# Patient Record
Sex: Male | Born: 1950 | Hispanic: No | Marital: Married | Smoking: Never smoker
Health system: Southern US, Community
[De-identification: ages and names within clinical notes are randomized; demographics above are authoritative.]

## PROBLEM LIST (undated history)

## (undated) DIAGNOSIS — E119 Type 2 diabetes mellitus without complications: Secondary | ICD-10-CM

## (undated) DIAGNOSIS — E785 Hyperlipidemia, unspecified: Secondary | ICD-10-CM

## (undated) DIAGNOSIS — IMO0001 Reserved for inherently not codable concepts without codable children: Secondary | ICD-10-CM

## (undated) DIAGNOSIS — E7800 Pure hypercholesterolemia, unspecified: Secondary | ICD-10-CM

## (undated) DIAGNOSIS — E1165 Type 2 diabetes mellitus with hyperglycemia: Secondary | ICD-10-CM

## (undated) DIAGNOSIS — R002 Palpitations: Secondary | ICD-10-CM

## (undated) HISTORY — DX: Palpitations: R00.2

## (undated) HISTORY — DX: Reserved for inherently not codable concepts without codable children: IMO0001

## (undated) HISTORY — DX: Pure hypercholesterolemia, unspecified: E78.00

## (undated) HISTORY — DX: Type 2 diabetes mellitus without complications: E11.9

## (undated) HISTORY — DX: Type 2 diabetes mellitus with hyperglycemia: E11.65

## (undated) HISTORY — DX: Hyperlipidemia, unspecified: E78.5

---

## 1997-12-14 ENCOUNTER — Inpatient Hospital Stay (HOSPITAL_COMMUNITY): Admission: EM | Admit: 1997-12-14 | Discharge: 1997-12-15 | Payer: Self-pay | Admitting: Emergency Medicine

## 1998-12-30 ENCOUNTER — Ambulatory Visit (HOSPITAL_COMMUNITY): Admission: RE | Admit: 1998-12-30 | Discharge: 1998-12-30 | Payer: Self-pay

## 2003-06-06 ENCOUNTER — Encounter: Admission: RE | Admit: 2003-06-06 | Discharge: 2003-06-06 | Payer: Self-pay | Admitting: Gastroenterology

## 2005-08-24 ENCOUNTER — Encounter: Admission: RE | Admit: 2005-08-24 | Discharge: 2005-08-24 | Payer: Self-pay | Admitting: Internal Medicine

## 2005-08-31 ENCOUNTER — Encounter: Admission: RE | Admit: 2005-08-31 | Discharge: 2005-08-31 | Payer: Self-pay | Admitting: Internal Medicine

## 2006-02-14 ENCOUNTER — Encounter (INDEPENDENT_AMBULATORY_CARE_PROVIDER_SITE_OTHER): Payer: Self-pay | Admitting: *Deleted

## 2006-02-14 ENCOUNTER — Ambulatory Visit (HOSPITAL_COMMUNITY): Admission: RE | Admit: 2006-02-14 | Discharge: 2006-02-14 | Payer: Self-pay | Admitting: Gastroenterology

## 2006-07-19 LAB — HM COLONOSCOPY

## 2007-02-22 ENCOUNTER — Encounter: Admission: RE | Admit: 2007-02-22 | Discharge: 2007-02-22 | Payer: Self-pay | Admitting: Neurology

## 2007-11-11 ENCOUNTER — Emergency Department (HOSPITAL_COMMUNITY): Admission: EM | Admit: 2007-11-11 | Discharge: 2007-11-11 | Payer: Self-pay | Admitting: Emergency Medicine

## 2007-11-11 ENCOUNTER — Ambulatory Visit: Payer: Self-pay | Admitting: Internal Medicine

## 2010-08-24 ENCOUNTER — Encounter (INDEPENDENT_AMBULATORY_CARE_PROVIDER_SITE_OTHER): Payer: Self-pay | Admitting: *Deleted

## 2010-08-24 ENCOUNTER — Other Ambulatory Visit: Payer: BC Managed Care – PPO

## 2010-08-24 ENCOUNTER — Ambulatory Visit (INDEPENDENT_AMBULATORY_CARE_PROVIDER_SITE_OTHER): Payer: BC Managed Care – PPO | Admitting: Endocrinology

## 2010-08-24 ENCOUNTER — Other Ambulatory Visit: Payer: Self-pay | Admitting: Endocrinology

## 2010-08-24 ENCOUNTER — Encounter: Payer: Self-pay | Admitting: Endocrinology

## 2010-08-24 DIAGNOSIS — E119 Type 2 diabetes mellitus without complications: Secondary | ICD-10-CM

## 2010-08-24 DIAGNOSIS — IMO0001 Reserved for inherently not codable concepts without codable children: Secondary | ICD-10-CM

## 2010-08-24 DIAGNOSIS — E1165 Type 2 diabetes mellitus with hyperglycemia: Secondary | ICD-10-CM | POA: Insufficient documentation

## 2010-08-24 HISTORY — DX: Reserved for inherently not codable concepts without codable children: IMO0001

## 2010-08-24 HISTORY — DX: Type 2 diabetes mellitus without complications: E11.9

## 2010-08-24 LAB — BASIC METABOLIC PANEL
CO2: 30 mEq/L (ref 19–32)
Chloride: 101 mEq/L (ref 96–112)
Creatinine, Ser: 0.9 mg/dL (ref 0.4–1.5)
Potassium: 4.7 mEq/L (ref 3.5–5.1)
Sodium: 138 mEq/L (ref 135–145)

## 2010-08-24 LAB — HEMOGLOBIN A1C: Hgb A1c MFr Bld: 8 % — ABNORMAL HIGH (ref 4.6–6.5)

## 2010-08-24 LAB — CK: Total CK: 56 U/L (ref 7–232)

## 2010-08-24 LAB — SEDIMENTATION RATE: Sed Rate: 20 mm/hr (ref 0–22)

## 2010-09-03 NOTE — Assessment & Plan Note (Signed)
Summary: NEW ENDO/BCBS/PCP=DR. BRUNETT/#/LB   Vital Signs:  Patient profile:   60 year old male Height:      71 inches (180.34 cm) Weight:      258.13 pounds (117.33 kg) BMI:     36.13 O2 Sat:      96 % on Room air Temp:     98.1 degrees F (36.72 degrees C) oral Pulse rate:   71 / minute Pulse rhythm:   regular BP sitting:   114 / 70  (left arm) Cuff size:   regular  Vitals Entered By: Brenton Grills CMA (AAMA) (August 24, 2010 1:23 PM)  O2 Flow:  Room air CC: New Endo Consult/DMII/aj Is Patient Diabetic? Yes   Primary Provider:  Mindi Curling MD  CC:  New Endo Consult/DMII/aj.  History of Present Illness: pt states 10 years h/o dm.  he is unaware of any chronic complications.  he has never been on insulin.  he takes 2 oral meds.  he says cbg's are 120-200.  it is in general higher later later in the day.   pt says his diet and exercise are much improved recently.  .   symptomatically, pt states 5 years of moderate myalgias at the legs/feet, and assoc erectile dysfunction.  avandia was changed to actos 4 mos ago, but myalgias have not improved.    Current Medications (verified): 1)  Actos 30 Mg Tabs (Pioglitazone Hcl) .Marland Kitchen.. 1 Tablet By Mouth Once Daily 2)  Metformin Hcl 1000 Mg Tabs (Metformin Hcl) .... 2 Tablet By Mouth Once Daily 3)  Enalapril Maleate 20 Mg Tabs (Enalapril Maleate) .Marland Kitchen.. 1 Tablet By Mouth Once Daily  Allergies (verified): No Known Drug Allergies  Past History:  Past Medical History: MYALGIA (ICD-729.1) DM (ICD-250.00)  Family History: Reviewed history and no changes required. Family History Ovarian cancer (Parent) Family History of Diabetes: sister  Social History: Reviewed history and no changes required. 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  The patient denies peripheral edema.         denies blurry vision, headache, chest pain, sob, n/v, urinary frequency, cramps, excessive diaphoresis, memory loss, depression, hypoglycemia, rhinorrhea, and easy bruising.  Physical Exam  General:  obese.  no distress  Head:  head: no deformity eyes: no periorbital swelling, no proptosis external nose and ears are normal mouth: no lesion seen Neck:  Supple without thyroid enlargement or tenderness.  Lungs:  Clear to auscultation bilaterally. Normal respiratory effort.  Heart:  Regular rate and rhythm without murmurs or gallops noted. Normal S1,S2.   Abdomen:  abdomen is soft, nontender.  no hepatosplenomegaly.   not distended.  no hernia central obesity.   Msk:  muscle bulk and strength are grossly normal.  no obvious joint swelling.  gait is normal and steady  Pulses:  dorsalis pedis intact bilat.  no carotid bruit  Extremities:  no deformity.  no ulcer on the feet.  feet are of normal color and temp.  there are healed abrasions on the left leg 1+ right pedal edema and 1+ left pedal edema.   Neurologic:  cn 2-12 grossly intact.   readily moves all 4's.   sensation is intact to touch on the feet  Skin:  normal texture and temp.  no rash.  not  diaphoretic  Cervical Nodes:  No significant adenopathy.  Psych:  Alert and cooperative; normal mood and affect; normal attention span and concentration.   Additional Exam:  Hemoglobin A1C       [H]  8.0 %                       4.6-6.5    Sed Rate                  20 mm/hr                    0-22    Creatine Kinase           56 U/L                      7-232    Sodium                    138 mEq/L                   135-145   Potassium                 4.7 mEq/L                   3.5-5.1   Chloride                  101 mEq/L                   96-112   Carbon Dioxide            30 mEq/L                    19-32   Glucose              [H]  170 mg/dL                    16-10   BUN                       15 mg/dL                    9-60   Creatinine                0.9 mg/dL                   4.5-4.0   Calcium                   9.2 mg/dL                   9.8-11.9    Impression & Recommendations:  Problem # 1:  DM (ICD-250.00) needs increased rx  Problem # 2:  MYALGIA (ICD-729.1) uncertain etiology not related to dm meds  Problem # 3:  edema this is a relative contraindication to actos  Medications Added to Medication List This Visit: 1)  Actos 30 Mg Tabs (Pioglitazone hcl) .Marland Kitchen.. 1 tablet by mouth once daily 2)  Metformin Hcl 1000 Mg Tabs (Metformin hcl) .... 2 tablet by mouth once daily 3)  Enalapril Maleate 20 Mg Tabs (Enalapril maleate) .Marland Kitchen.. 1 tablet by mouth once daily 4)  Januvia 100 Mg Tabs (Sitagliptin phosphate) .Marland Kitchen.. 1 tab each am  Other Orders: TLB-A1C / Hgb A1C (Glycohemoglobin) (83036-A1C) TLB-Sedimentation Rate (ESR) (85652-ESR)  TLB-CK Total Only(Creatine Kinase/CPK) (82550-CK) TLB-BMP (Basic Metabolic Panel-BMET) (80048-METABOL) New Patient Level IV (19147)  Patient Instructions: 1)  blood tests are being ordered for you today.  please call [phone] to hear your test results. 2)  pending the test results, please add januvia 100 mg once daily. 3)  good diet and exercise habits significanly improve the control of your diabetes.  please let me know if you wish to be referred to a dietician.  high blood sugar is very risky to your health.  you should see an eye doctor every year. 4)  controlling your blood pressure and cholesterol drastically reduces the damage diabetes does to your body.  this also applies to quitting smoking.  please discuss these with your doctor.  you should take an aspirin every day, unless you have been advised by a doctor not to. 5)  check your blood sugar 1 time a day.  vary the time of day when you check, between before the 3 meals, and at bedtime.  also check if you have symptoms of your blood sugar  being too high or too low.  please keep a record of the readings and bring it to your next appointment here.  please call us sooner if you are having low blood sugar episodes.   6)  Please schedule a follow-up appointment in 3 months. 7)  (update: i left message on phone-tree:  add januvia 100 mg each am) Prescriptions: JANUVIA 100 MG TABS (SITAGLIPTIN PHOSPHATE) 1 tab each am  #30 x 11   Entered and Authorized by:   Minus Breeding MD   Signed by:   Minus Breeding MD on 08/24/2010   Method used:   Electronically to        [facility]  [phone]* (retail)       [address]       [place], [place]  [address]       Ph: [phone] or [phone]       Fax: [phone]   RxID:   [id number]    Orders Added: 1)  TLB-A1C / Hgb A1C (Glycohemoglobin) [83036-A1C] 2)  TLB-Sedimentation Rate (ESR) [85652-ESR] 3)  TLB-CK Total Only(Creatine Kinase/CPK) [82550-CK] 4)  TLB-BMP (Basic Metabolic Panel-BMET) [80048-METABOL] 5)  New Patient Level IV [18841]   Immunization History:  Influenza Immunization History:    Influenza:  historical (03/19/2010)   Immunization History:  Influenza Immunization History:    Influenza:  Historical (03/19/2010)   Preventive Care Screening  Last Tetanus Booster:    Date:  07/19/2009    Results:  Adacel   Colonoscopy:    Date:  07/19/2006    Results:  done

## 2010-09-11 ENCOUNTER — Telehealth: Payer: Self-pay | Admitting: Endocrinology

## 2010-09-15 NOTE — Progress Notes (Signed)
Summary: Rx refill req  Phone Note Refill Request Message from:  Patient on September 11, 2010 1:55 PM  Refills Requested: Medication #1:  METFORMIN HCL 1000 MG TABS 2 tablet by mouth once daily   Dosage confirmed as above?Dosage Confirmed   Supply Requested: 1 year  Medication #2:  ENALAPRIL MALEATE 20 MG TABS 1 tablet by mouth once daily   Dosage confirmed as above?Dosage Confirmed   Supply Requested: 1 year  Medication #3:  JANUVIA 100 MG TABS 1 tab each am.   Dosage confirmed as above?Dosage Confirmed   Supply Requested: 1 year Pt requests Rxs to [facility]   Method Requested: Electronic Initial call taken by: Margaret Pyle, CMA,  September 11, 2010 1:56 PM    Prescriptions: JANUVIA 100 MG TABS (SITAGLIPTIN PHOSPHATE) 1 tab each am  #90 x 3   Entered by:   Margaret Pyle, CMA   Authorized by:   Minus Breeding MD   Signed by:   Margaret Pyle, CMA on 09/11/2010   Method used:   Electronically to        [facility]* (retail)       [address] [place], [place]  [address]       Ph: [phone]       Fax: [phone]   RxID:   [id number] ENALAPRIL MALEATE 20 MG TABS (ENALAPRIL MALEATE) 1 tablet by mouth once daily  #90 x 3   Entered by:   Margaret Pyle, CMA   Authorized by:   Minus Breeding MD   Signed by:   Margaret Pyle, CMA on 09/11/2010   Method used:   Electronically to        [facility]* (retail)       [address] [place], [place]  [address]       Ph: [phone]       Fax: [phone]   RxID:   [id number] METFORMIN HCL 1000 MG TABS (METFORMIN HCL) 2 tablet by mouth once daily  #180 x 3   Entered by:   Margaret Pyle, CMA   Authorized by:   Minus Breeding MD   Signed by:   Margaret Pyle, CMA on 09/11/2010   Method used:   Electronically to        [facility]* (retail)       [address] [place], [place]  [address]       Ph: [phone]       Fax: [phone]   RxID:   [id number]

## 2010-11-23 ENCOUNTER — Ambulatory Visit: Payer: BC Managed Care – PPO | Admitting: Endocrinology

## 2010-11-25 ENCOUNTER — Encounter: Payer: Self-pay | Admitting: Endocrinology

## 2010-11-25 ENCOUNTER — Other Ambulatory Visit (INDEPENDENT_AMBULATORY_CARE_PROVIDER_SITE_OTHER): Payer: BC Managed Care – PPO

## 2010-11-25 ENCOUNTER — Ambulatory Visit (INDEPENDENT_AMBULATORY_CARE_PROVIDER_SITE_OTHER): Payer: BC Managed Care – PPO | Admitting: Endocrinology

## 2010-11-25 VITALS — BP 100/66 | HR 69 | Temp 97.6°F | Ht 71.0 in | Wt 243.8 lb

## 2010-11-25 DIAGNOSIS — E119 Type 2 diabetes mellitus without complications: Secondary | ICD-10-CM

## 2010-11-25 LAB — GLUCOSE, POCT (MANUAL RESULT ENTRY): POC Glucose: 156

## 2010-11-25 LAB — HEMOGLOBIN A1C: Hgb A1c MFr Bld: 7.6 % — ABNORMAL HIGH (ref 4.6–6.5)

## 2010-11-25 MED ORDER — ENALAPRIL MALEATE 20 MG PO TABS: 20.0000 mg | ORAL_TABLET | Freq: Every day | ORAL | Status: DC

## 2010-11-25 MED ORDER — BROMOCRIPTINE MESYLATE 2.5 MG PO TABS: ORAL_TABLET | ORAL | Status: DC

## 2010-11-25 NOTE — Patient Instructions (Addendum)
blood tests are being ordered for you today.  please call [phone] to hear your test results.  You will be prompted to enter the 9-digit "MRN" number that appears at the top left of this page, followed by #.  Then you will hear the message. Based on the results, i will probably advise you to add another pill called "bromocriptine," at bedtime.  It has possible side-effects of dizziness and nausea.  These go away with time.  You can avoid these by starting with 1/2 pill for the first week, until your body gets used to it.   Please make a follow-up appointment in 3 months (update: i left message on phone-tree: add parlodel as above).

## 2010-11-25 NOTE — Progress Notes (Signed)
  Subjective:    Patient ID: Alexander Mckay, male    DOB: 12/11/1954, 60 y.o.   MRN: [id number]  HPI no cbg record, but states cbg's are still in the high-100's.  pt states he feels well in general.   Past Medical History  Diagnosis Date  . DM 08/24/2010  . MYALGIA 08/24/2010    No past surgical history on file.  History   Social History  . Marital Status: Married    Spouse Name: N/A    Number of Children: N/A  . Years of Education: N/A   Occupational History  .      Retired   Social History Main Topics  . Smoking status: Never Smoker   . Smokeless tobacco: Not on file  . Alcohol Use: No  . Drug Use: No  . Sexually Active:    Other Topics Concern  . 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 metFORMIN (GLUCOPHAGE) 1000 MG tablet 2 tablets by mouth once daily       . pioglitazone (ACTOS) 30 MG tablet Take 30 mg by mouth daily.        . sitaGLIPtan (JANUVIA) 100 MG tablet Take 100 mg by mouth daily.          No Known Allergies  Family History  Problem Relation Age of Onset  . Cancer Mother     Ovarian Cancer  . Diabetes Sister     BP 100/66  Pulse 69  Temp(Src) 97.6 F (36.4 C) (Oral)  Ht 5\' 11"  (1.803 m)  Wt 243 lb 12.8 oz (110.587 kg)  BMI 34.00 kg/m2  SpO2 95%    Review of Systems denies hypoglycemia.    Objective:   Physical Exam GENERAL: no distress Ext:  Trace bilat leg edema.    Lab Results  Component Value Date   HGBA1C 7.6* 11/25/2010     Assessment & Plan:  Dm, needs increased rx

## 2010-12-01 NOTE — Consult Note (Signed)
Alexander Mckay, KOEPPEN           ACCOUNT NO.:  [id number]   MEDICAL RECORD NO.:  [id number]           PATIENT TYPE:   LOCATION:                                 FACILITY:   PHYSICIAN:  Doylene Canning. Ladona Ridgel, MD    DATE OF BIRTH:  1951/02/24   DATE OF CONSULTATION:  DATE OF DISCHARGE:                                 CONSULTATION   The consultation is requested by Dr. Caryl Never for evaluation of SVT.  The patient's chief complaint is palpitations and shortness of breath  and feeling bad.   HISTORY OF PRESENT ILLNESS:  The patient is a 60 year old man with a  longstanding history of tachy palpitations.  He presented to the  hospital approximately 15 years ago with SVT and would receive treatment  with what sounds like adenosine.  He has had spells in the past, but no  prolonged episodes until he presented to Dr. Evelena Peat at  [facility] office with complaints of feeling bad and  palpitations and EKG subsequently demonstrates SVT at 180 beats per  minute.  He was transferred here for additional evaluation.  The patient  has not had any chest pain.  He has had no syncope.   PAST MEDICAL HISTORY:  Notable for SVT as noted.  He also has  hypertension x8 years.  No other significant medical problems that he  reports.   FAMILY HISTORY:  Notable for father dying of coronary disease in middle  age and mother died of kidney failure.   SOCIAL HISTORY:  The patient is married.  He denies tobacco or ethanol  abuse.  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  He has lived  in [place] for 30 years.   REVIEW OF SYSTEMS:  Really unremarkable except as noted in the HPI.  All  systems were reviewed.   PHYSICAL EXAMINATION:  GENERAL:  He is a pleasant well-appearing, middle-  aged man in no distress.  VITAL SIGNS:  The blood pressure was 115/80, the pulse was 180 and  regular, the respirations were 20, and temperature was 98.  HEENT:  Normocephalic and  atraumatic.  Pupils were equal and round.  Oropharynx was moist.  Sclerae anicteric.  NECK:  Revealed no jugular venous distention and no thyromegaly.  Trachea was midline.  Carotids were 2+ and symmetric.  LUNGS:  Clear bilaterally to auscultation.  No wheezes, rales, or  rhonchi were present.  There were no increased work of breathing.  CARDIOVASCULAR:  Revealed regular tachycardia with normal S1 and S2.  There were no murmurs appreciated.  The PMI was not enlarged nor was  laterally displaced.  ABDOMEN:  Soft, nontender, and nondistended.  No organomegaly.  The  bowel sounds were present.  No rebound or guarding.  EXTREMITIES:  Demonstrated no cyanosis, clubbing, or edema.  The pulses  were 2+ and symmetric.  NEUROLOGIC:  Alert and oriented x3.  His cranial nerves intact.  Strength was 5/5 and symmetric.   EKG demonstrates SVT at 180 beats per minute.  This is a short RP  tachycardia.  The old EKG demonstrates sinus rhythm with  no ventricular  pre-excitation.   IMPRESSION:  Supraventricular tachycardia.  Today, we tried having the  patient performing vagal maneuvers as well as carotid sinus massage,  which were unsuccessful and terminated his supraventricular tachycardia.  He was subsequently given 6 mg of adenosine, which terminated his  supraventricular tachycardia restoring sinus rhythm.  I planned to  observe the patient for approximately 1 hour.  We will plan on having  him go home.  We will have a stress test as an outpatient now.  I will  see him back in the office to discuss the possibility of catheter  ablation in the next several weeks.      Doylene Canning. Ladona Ridgel, MD  Electronically Signed     GWT/MEDQ  D:  11/11/2007  T:  11/11/2007  Job:  [id number]   cc:   Evelena Peat, M.D.

## 2010-12-04 NOTE — Op Note (Signed)
Alexander Mckay, Alexander Mckay           ACCOUNT NO.:  [id number]   MEDICAL RECORD NO.:  [id number]          PATIENT TYPE:  AMB   LOCATION:  ENDO                         FACILITY:  [facility]   PHYSICIAN:  Anselmo Rod, M.D.  DATE OF BIRTH:  12/11/1954   DATE OF PROCEDURE:  DATE OF DISCHARGE:                                 OPERATIVE REPORT   DATE OF PROCEDURE:  February 14, 2006.   PROCEDURE PERFORMED:  Colonoscopy with cold biopsy x1.  Endoscopy showed  __________ Olympus video colonoscope.   INDICATIONS FOR PROCEDURE:  This patient is a 60 year old male undergoing a  screening colonoscopy to rule out colonic polyps, masses, etc.  The patient  has a history of left lower quadrant discomfort and was found to be guaiac-  positive during a routine physical at the office.   PRE-PROCEDURE PREP:  Informed consent was secured from the patient.  The  patient fasted for eight hours prior to the procedure after being  prepped  with a gallon of GoLYTELY the night prior to the procedure.  Risks and  benefits of the procedure including a 10 percent missed rate of cancer and  polyps were discussed with the patient as well.   PRE-PROCEDURE PHYSICAL EXAMINATION:  The patient had stable vital signs.  Neck supple.  Chest clear to auscultation.  Abdomen was soft with normal  bowel sounds.   DESCRIPTION OF PROCEDURE:  The patient was placed in the left lateral  decubitus position.  Sedated with 75 mg of fentanyl and 7.5 mg of versed in  slow incremental doses.  Once the patient was adequately sedated and  maintained on low-flow oxygen and continuous cardiac monitoring, the Olympus  video colonoscope was advanced from the rectum to the cecum with difficulty.  There was significant __________ from the colon.  Multiple washings were  done.  The appendiceal orifice and ileocecal valve  were visualized and  __________ the terminal ileum.  A small sessile polyp was biopsied in the  proximal right colon with cold  biopsy times one.  There was no evidence of  diverticulosis.  Retroflexion revealed no abnormalities.  The patient  tolerated the procedure well without immediate complications.   IMPRESSION:  Screening colonoscopy within normal limits except for a small  sessile polyp, removed by cold biopsy from the proximal right colon.   RECOMMENDATIONS:  1.  We will await pathology results.  2.  Avoid all nonsteroidal anti-inflammatories for the next two weeks.  3.  Repeat guaiacs on an outpatient basis.  4.  The patient is to follow-up in the next two weeks.  5.  Repeat colonoscopy will be done depending on pathology results.           ______________________________  Anselmo Rod, M.D.     JNM/MEDQ  D:  02/14/2006  T:  02/15/2006  Job:  [id number]   cc:   Marjory Lies, M.D.  Fax: [phone]

## 2010-12-04 NOTE — Op Note (Signed)
Alexander Mckay, Alexander Mckay           ACCOUNT NO.:  [id number]   MEDICAL RECORD NO.:  [id number]          PATIENT TYPE:  AMB   LOCATION:  ENDO                         FACILITY:  [facility]   PHYSICIAN:  Anselmo Rod, M.D.  DATE OF BIRTH:  12/11/1954   DATE OF PROCEDURE:  DATE OF DISCHARGE:                               OPERATIVE REPORT   DATE OF PROCEDURE:  February 14, 2006.   PROCEDURE PERFORMED:  Colonoscopy with cold biopsy x1.   INDICATIONS FOR PROCEDURE:  This patient is a 60 year old male  undergoing a screening colonoscopy to rule out colonic polyps, masses,  etc.  The patient has a history of left lower quadrant discomfort and  was found to be guaiac-positive during a routine physical at the office.   PRE-PROCEDURE PREP:  Informed consent was secured from the patient.  The  patient fasted for eight hours prior to the procedure after being  prepped with a gallon of GoLYTELY the night prior to the procedure.  Risks and benefits of the procedure including a 10 percent missed rate  of cancer and polyps were discussed with the patient as well.   PRE-PROCEDURE PHYSICAL EXAMINATION:  The patient had stable vital signs.  Neck supple.  Chest clear to auscultation.  Abdomen was soft with normal  bowel sounds.   DESCRIPTION OF PROCEDURE:  The patient was placed in the left lateral  decubitus position.  Sedated with 75 mg of fentanyl and 7.5 mg of versed  in slow incremental doses.  Once the patient was adequately sedated and  maintained on low-flow oxygen and continuous cardiac monitoring, the  Olympus video colonoscope was advanced from the rectum to the cecum with  difficulty.  There was significant amount of residual stool in the  colon.  Multiple washings were done.  The appendiceal orifice and  ileocecal valve  were visualized and photos taken.  The terminal ileum  was not visualized.  A small sessile polyp was biopsied in the proximal  right colon with cold biopsy times one.  There  was no evidence of  diverticulosis.  Retroflexion revealed no abnormalities.  The patient  tolerated the procedure well without immediate complications.   IMPRESSION:  Screening colonoscopy within normal limits except for a  small sessile polyp, removed by cold biopsy from the proximal right  colon.   RECOMMENDATIONS:  1.  We will await pathology results.  2.  Avoid all nonsteroidal anti-inflammatories for the next two weeks.  3.  Repeat guaiacs on an outpatient basis.  4.  The patient is to follow-up in the next two weeks.  5.  Repeat colonoscopy will be done depending on pathology results.           ______________________________  Anselmo Rod, M.D.     JNM/MEDQ  D:  02/14/2006  T:  02/15/2006  Job:  [id number]   cc:   Marjory Lies, M.D.  Fax: [phone]

## 2012-11-17 ENCOUNTER — Ambulatory Visit (INDEPENDENT_AMBULATORY_CARE_PROVIDER_SITE_OTHER): Payer: BC Managed Care – PPO | Admitting: Family Medicine

## 2012-11-17 VITALS — BP 97/58 | HR 94 | Temp 99.4°F | Resp 16 | Ht 71.0 in | Wt 212.0 lb

## 2012-11-17 DIAGNOSIS — J329 Chronic sinusitis, unspecified: Secondary | ICD-10-CM

## 2012-11-17 DIAGNOSIS — J209 Acute bronchitis, unspecified: Secondary | ICD-10-CM

## 2012-11-17 MED ORDER — LEVOFLOXACIN 500 MG PO TABS: 500.0000 mg | ORAL_TABLET | Freq: Every day | ORAL | Status: DC

## 2012-11-17 NOTE — Progress Notes (Signed)
Patient ID: Dequan Kindred MRN: [id number], DOB: 12/11/1954, 62 y.o. Date of Encounter: 11/17/2012, 5:03 PM  Primary Physician: No primary provider on file.  Chief Complaint:  Chief Complaint  Patient presents with  . Cough    x 3 days  . Fever    low grade 99.4    HPI: 62 y.o. year old male presents with a 3 day history of nasal congestion, post nasal drip, sore throat, and cough. Mild sinus pressure. Afebrile. No chills. Nasal congestion thick and green/yellow. Cough is productive of green/yellow sputum and not associated with time of day. Ears feel full, leading to sensation of muffled hearing. Has tried OTC cold preps without success. No GI complaints.   No sick contacts, recent antibiotics, or recent travels.   No leg trauma, sedentary periods, h/o cancer, or tobacco use.  Past Medical History  Diagnosis Date  . DM 08/24/2010  . MYALGIA 08/24/2010     Home Meds: Prior to Admission medications   Medication Sig Start Date End Date Taking? Authorizing Provider  aspirin 81 MG tablet Take 81 mg by mouth daily.   Yes Historical Provider, MD  atorvastatin (LIPITOR) 20 MG tablet Take 20 mg by mouth daily.   Yes Historical Provider, MD  lisinopril (PRINIVIL,ZESTRIL) 10 MG tablet Take 10 mg by mouth daily.   Yes Historical Provider, MD  metFORMIN (GLUCOPHAGE) 1000 MG tablet 2 tablets by mouth once daily    Yes Historical Provider, MD    Allergies: No Known Allergies  History   Social History  . Marital Status: Married    Spouse Name: N/A    Number of Children: N/A  . Years of Education: N/A   Occupational History  .      Retired   Social History Main Topics  . Smoking status: Never Smoker   . Smokeless tobacco: Not on file  . Alcohol Use: No  . Drug Use: No  . Sexually Active:    Other Topics Concern  . Not on file   Social History Narrative   Regular exercise-yes   Works Real Estate   Originally from [place]     Review of Systems: Constitutional: negative  for chills, fever, night sweats or weight changes Cardiovascular: negative for chest pain or palpitations Respiratory: negative for hemoptysis, wheezing, or shortness of breath Abdominal: negative for abdominal pain, nausea, vomiting or diarrhea Dermatological: negative for rash Neurologic: negative for headache   Physical Exam: Blood pressure 97/58, pulse 94, temperature 99.4 F (37.4 C), temperature source Oral, resp. rate 16, height 5\' 11"  (1.803 m), weight 212 lb (96.163 kg), SpO2 95.00%., Body mass index is 29.58 kg/(m^2). General: Well developed, well nourished, in no acute distress. Head: Normocephalic, atraumatic, eyes without discharge, sclera non-icteric, nares are congested. Bilateral auditory canals clear, TM's are without perforation, pearly grey with reflective cone of light bilaterally. No sinus TTP. Oral cavity moist, dentition normal. Posterior pharynx with post nasal drip and mild erythema. No peritonsillar abscess or tonsillar exudate. Neck: Supple. No thyromegaly. Full ROM. No lymphadenopathy. Lungs: Coarse breath sounds bilaterally with, rales right side. Breathing is unlabored.  Heart: RRR with S1 S2. No murmurs, rubs, or gallops appreciated. Msk:  Strength and tone normal for age. Extremities: No clubbing or cyanosis. No edema. Neuro: Alert and oriented X 3. Moves all extremities spontaneously. CNII-XII grossly in tact. Psych:  Responds to questions appropriately with a normal affect.   Labs:   ASSESSMENT AND PLAN:  62 y.o. year old male with bronchitis.  Acute bronchitis - Plan: DISCONTINUED: levofloxacin (LEVAQUIN) 500 MG tablet  Sinusitis - Plan: DISCONTINUED: levofloxacin (LEVAQUIN) 500 MG tablet  -Mucinex -Tylenol/Motrin prn -Rest/fluids -RTC precautions -RTC 3-5 days if no improvement  Signed, Elvina Sidle, MD 11/17/2012 5:03 PM

## 2013-12-31 ENCOUNTER — Telehealth: Payer: Self-pay | Admitting: Endocrinology

## 2013-12-31 NOTE — Telephone Encounter (Signed)
Rec'd from Dr. London SheerPeter Dunn forward 4 pages to Dr.Ellison

## 2018-08-24 ENCOUNTER — Ambulatory Visit (INDEPENDENT_AMBULATORY_CARE_PROVIDER_SITE_OTHER): Payer: Medicare Other | Admitting: Cardiology

## 2018-08-24 ENCOUNTER — Encounter: Payer: Self-pay | Admitting: Cardiology

## 2018-08-24 DIAGNOSIS — E7800 Pure hypercholesterolemia, unspecified: Secondary | ICD-10-CM | POA: Diagnosis not present

## 2018-08-24 DIAGNOSIS — E1165 Type 2 diabetes mellitus with hyperglycemia: Secondary | ICD-10-CM

## 2018-08-24 HISTORY — DX: Pure hypercholesterolemia, unspecified: E78.00

## 2018-08-24 NOTE — Progress Notes (Signed)
Subjective:   @Patient  ID: Alexander Mckay, male    DOB: 12/11/1954, 68 y.o.   MRN: [id number]  No chief complaint on file.   HPI   Mr. Alexander Mckay is a pleasant gentleman [place] originally. He has diabetes which he states is uncontrolled He is no other specific complaints. He has h/o SVT per history many years ago and no recurrence of SVT over the last few years.  Patient denies any symptoms of claudication, TIA. He denies any chest pain or shortness of breath. No dizziness or syncope. He now presents here for his 834-month office visit, overall doing well and states that he wants a complete check up.  Denies any symptoms of claudication or TIA. He denies any headache or visual disturbances. No dizziness or syncope. Patient does have erectile dysfunction and responds well to Viagra.  Past Medical History:  Diagnosis Date  . Diabetes mellitus without complication (HCC)   . DM 08/24/2010  . Hyperlipidemia   . MYALGIA 08/24/2010  . Palpitations   . Pure hypercholesterolemia 08/24/2018  . Uncontrolled type 2 diabetes mellitus without complication (HCC) 08/24/2010   Qualifier: Diagnosis of  By: Everardo AllEllison MD, Sean A     No past surgical history on file.  Social History   Socioeconomic History  . Marital status: Married    Spouse name: Not on file  . Number of children: 0  . Years of education: Not on file  . Highest education level: Not on file  Occupational History    Comment: Retired  Engineer, productionocial Needs  . Financial resource strain: Not on file  . Food insecurity:    Worry: Not on file    Inability: Not on file  . Transportation needs:    Medical: Not on file    Non-medical: Not on file  Tobacco Use  . Smoking status: Never Smoker  . Smokeless tobacco: Never Used  Substance and Sexual Activity  . Alcohol use: Not Currently  . Drug use: Never  . Sexual activity: Not on file  Lifestyle  . Physical activity:    Days per week: Not on file    Minutes per session: Not  on file  . Stress: Not on file  Relationships  . Social connections:    Talks on phone: Not on file    Gets together: Not on file    Attends religious service: Not on file    Active member of club or organization: Not on file    Attends meetings of clubs or organizations: Not on file    Relationship status: Not on file  . Intimate partner violence:    Fear of current or ex partner: Not on file    Emotionally abused: Not on file    Physically abused: Not on file    Forced sexual activity: Not on file  Other Topics Concern  . Not on file  Social History Narrative   ** Merged History Encounter **       Regular exercise-yes   Works Real Estate   Originally from [place]    Current Outpatient Medications on File Prior to Visit  Medication Sig Dispense Refill  . aspirin 81 MG chewable tablet Chew 81 mg by mouth daily.    Marland Kitchen. aspirin 81 MG tablet Take 81 mg by mouth daily.    Marland Kitchen. atorvastatin (LIPITOR) 20 MG tablet Take 20 mg by mouth daily.    Marland Kitchen. glipiZIDE (GLUCOTROL) 5 MG tablet Take 5 mg by mouth 2 (two) times daily before  a meal.    . lisinopril (PRINIVIL,ZESTRIL) 10 MG tablet Take 10 mg by mouth daily.    Marland Kitchen lisinopril (PRINIVIL,ZESTRIL) 10 MG tablet Take 10 mg by mouth daily. 1/2 tablet daily    . metFORMIN (GLUCOPHAGE) 1000 MG tablet 2 tablets by mouth once daily      No current facility-administered medications on file prior to visit.      ROS     Objective:  There were no vitals taken for this visit.  Physical Exam  Constitutional: He appears well-developed and well-nourished. No distress.  HENT:  Head: Atraumatic.  Eyes: Conjunctivae are normal.  Neck: Neck supple. No JVD present. No thyromegaly present.  Cardiovascular: Normal rate, regular rhythm, normal heart sounds and intact distal pulses. Exam reveals no gallop.  No murmur heard. Pulmonary/Chest: Effort normal and breath sounds normal.  Abdominal: Soft. Bowel sounds are normal.  Musculoskeletal: Normal range of  motion.        General: No edema.  Neurological: He is alert.  Skin: Skin is warm and dry.  Psychiatric: He has a normal mood and affect.    Assessment & Recommendations:  1. Hyperlipidemia EKG 09/07/18/20: Normal sinus rhythm at the rate of 63 bpm, normal axis, poor R-wave progression, probably normal variant.  Normal QT interval.  2. Uncontrolled DM without complications   Alexander Decamp, MD, [facility] 08/24/2018, 6:17 AM [facility] [place] Pager: [phone] Office: [phone] If no answer Cell [phone]

## 2018-09-07 ENCOUNTER — Encounter: Payer: Self-pay | Admitting: Cardiology

## 2018-09-07 ENCOUNTER — Ambulatory Visit (INDEPENDENT_AMBULATORY_CARE_PROVIDER_SITE_OTHER): Payer: Medicare Other | Admitting: Cardiology

## 2018-09-07 VITALS — BP 132/81 | HR 67 | Ht 67.0 in | Wt 226.0 lb

## 2018-09-07 DIAGNOSIS — E7800 Pure hypercholesterolemia, unspecified: Secondary | ICD-10-CM

## 2018-09-07 DIAGNOSIS — Z0189 Encounter for other specified special examinations: Secondary | ICD-10-CM | POA: Diagnosis not present

## 2018-09-07 DIAGNOSIS — Z8679 Personal history of other diseases of the circulatory system: Secondary | ICD-10-CM | POA: Diagnosis not present

## 2018-09-07 DIAGNOSIS — R002 Palpitations: Secondary | ICD-10-CM | POA: Insufficient documentation

## 2018-09-07 DIAGNOSIS — Z6835 Body mass index (BMI) 35.0-35.9, adult: Secondary | ICD-10-CM

## 2018-09-07 DIAGNOSIS — E1165 Type 2 diabetes mellitus with hyperglycemia: Secondary | ICD-10-CM | POA: Diagnosis not present

## 2018-09-07 DIAGNOSIS — E66812 Obesity, class 2: Secondary | ICD-10-CM

## 2018-09-07 DIAGNOSIS — IMO0001 Reserved for inherently not codable concepts without codable children: Secondary | ICD-10-CM

## 2018-09-07 HISTORY — DX: Palpitations: R00.2

## 2018-09-07 NOTE — Progress Notes (Signed)
Subjective:   @Patient  ID: Alexander Mckay, male    DOB: [birth year], 68 y.o.   MRN: [id number]  Chief Complaint  Patient presents with  . Palpitations  . Follow-up    HPI   Mr. Alexander Mckay is a pleasant gentleman [place] originally. He has diabetes which he states is uncontrolled He is no other specific complaints. He has h/o SVT per history many years ago and no recurrence of SVT over the last few years.  Patient denies any symptoms of claudication, TIA. He denies any chest pain or shortness of breath. No dizziness or syncope. He now presents here for his 49-month office visit, overall doing well and states that he wants a complete check up.  Denies any symptoms of claudication or TIA. He denies any headache or visual disturbances. No dizziness or syncope. Patient does have erectile dysfunction and responds well to Viagra. States he has been scheduled for a stress test by his PCP.  Past Medical History:  Diagnosis Date  . Diabetes mellitus without complication (HCC)   . DM 08/24/2010  . Hyperlipidemia   . MYALGIA 08/24/2010  . Palpitations   . Palpitations 09/07/2018  . Pure hypercholesterolemia 08/24/2018  . Uncontrolled type 2 diabetes mellitus without complication (HCC) 08/24/2010   Qualifier: Diagnosis of  By: Everardo All MD, Gregary Signs A     History reviewed. No pertinent surgical history.  Social History   Socioeconomic History  . Marital status: Married    Spouse name: Not on file  . Number of children: 0  . Years of education: Not on file  . Highest education level: Not on file  Occupational History    Comment: Retired  Engineer, production  . Financial resource strain: Not on file  . Food insecurity:    Worry: Not on file    Inability: Not on file  . Transportation needs:    Medical: Not on file    Non-medical: Not on file  Tobacco Use  . Smoking status: Never Smoker  . Smokeless tobacco: Never Used  Substance and Sexual Activity  . Alcohol use: Not Currently   . Drug use: Never  . Sexual activity: Not on file  Lifestyle  . Physical activity:    Days per week: Not on file    Minutes per session: Not on file  . Stress: Not on file  Relationships  . Social connections:    Talks on phone: Not on file    Gets together: Not on file    Attends religious service: Not on file    Active member of club or organization: Not on file    Attends meetings of clubs or organizations: Not on file    Relationship status: Not on file  . Intimate partner violence:    Fear of current or ex partner: Not on file    Emotionally abused: Not on file    Physically abused: Not on file    Forced sexual activity: Not on file  Other Topics Concern  . Not on file  Social History Narrative   ** Merged History Encounter **       Regular exercise-yes   Works Real Estate   Originally from [place]    Current Outpatient Medications on File Prior to Visit  Medication Sig Dispense Refill  . aspirin 81 MG chewable tablet Chew 81 mg by mouth daily.    Marland Kitchen atorvastatin (LIPITOR) 20 MG tablet Take 20 mg by mouth daily.    Marland Kitchen glipiZIDE (GLUCOTROL) 5 MG tablet  Take 5 mg by mouth 2 (two) times daily before a meal.    . lisinopril (PRINIVIL,ZESTRIL) 10 MG tablet Take 10 mg by mouth daily.    . Saxagliptin-Metformin (KOMBIGLYZE XR) 2.11-998 MG TB24 Take by mouth 3 (three) times daily.    Marland Kitchen aspirin 81 MG tablet Take 81 mg by mouth daily.    Marland Kitchen lisinopril (PRINIVIL,ZESTRIL) 10 MG tablet Take 10 mg by mouth daily. 1/2 tablet daily    . metFORMIN (GLUCOPHAGE) 1000 MG tablet 2 tablets by mouth once daily      No current facility-administered medications on file prior to visit.    Review of Systems  Constitutional: Negative for malaise/fatigue and weight loss.  Respiratory: Negative for cough, hemoptysis and shortness of breath.   Cardiovascular: Negative for chest pain, palpitations, claudication and leg swelling.  Gastrointestinal: Negative for abdominal pain, blood in stool,  constipation, heartburn and vomiting.  Genitourinary: Negative for dysuria.  Musculoskeletal: Negative for joint pain and myalgias.  Neurological: Negative for dizziness, focal weakness and headaches.  Endo/Heme/Allergies: Does not bruise/bleed easily.  Psychiatric/Behavioral: Negative for depression. The patient is not nervous/anxious.   All other systems reviewed and are negative.      Objective:  Blood pressure 132/81, pulse 67, height 5\' 7"  (1.702 m), weight 226 lb (102.5 kg), SpO2 97 %. Body mass index is 35.4 kg/m.  Physical Exam  Constitutional: He appears well-developed. No distress.  Moderately obese  HENT:  Head: Atraumatic.  Eyes: Conjunctivae are normal.  Neck: Neck supple. No JVD present. No thyromegaly present.  Cardiovascular: Normal rate, regular rhythm, normal heart sounds and intact distal pulses. Exam reveals no gallop.  No murmur heard. Pulmonary/Chest: Effort normal and breath sounds normal.  Abdominal: Soft. Bowel sounds are normal.  Musculoskeletal: Normal range of motion.        General: No edema.  Neurological: He is alert.  Skin: Skin is warm and dry.  Psychiatric: He has a normal mood and affect.   Assessment & Recommendations:  1. Hyperlipidemia  2. Uncontrolled DM without complications  3. 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 Terminated with carotid massage per patient.  EKG 09/07/18/20: Normal sinus rhythm at the rate of 63 bpm, normal axis, poor R-wave progression, probably normal variant.  Normal QT interval.  4. Moderate Obesity  Recommendation: He is here on annual visit and follow-up of palpitations, symptoms or remained stable and he has not had any further rapid applications. EKG reveals sinus rhythm, No significant abnormality. No evidence of ischemia. He continues to exercise at least 30 minutes 5 days a week on the treadmill and also doing some mild weight exercises, remains asymptomatic without chest pain or palpitations.  He has  been scheduled for a complete physical next month.  He states that he has been scheduled for a stress test.  Lipids are being managed by his PCP, diabetes has been uncontrolled, I have suggested that he go on Bydureon which will help with weight loss and also diabetes control. Referral to endocrinology was made to Dr. Reather Littler. I'll see him back on annual basis.  Yates Decamp, MD, [facility] 09/08/2018, 9:09 AM [facility] [place] Pager: [phone] Office: [phone] If no answer Cell [phone]

## 2018-09-08 ENCOUNTER — Encounter: Payer: Self-pay | Admitting: Cardiology

## 2018-09-08 NOTE — Progress Notes (Signed)
Pre charting and hence not complete.

## 2018-10-03 LAB — HM DIABETES EYE EXAM

## 2018-10-08 NOTE — Progress Notes (Signed)
Subjective:   _0  ID: Alexander Mckay, male    DOB: 07-27-1950, 68 y.o.   MRN: [id number]  Chief Complaint  Patient presents with  . Palpitations  . Follow-up    HPI   Mr. Drelyn Pistilli is a pleasant gentleman from [place] originally. He has uncontrolled diabetes, hyperlipidemia.  He has h/o SVT per history many years ago and no recurrence of SVT over the last few years.  Patient denies any symptoms of claudication, TIA. He denies any chest pain or shortness of breath. No dizziness or syncope. He underwent nuclear stress test by his PCP, he wanted to come back to discuss the results.  Patient was on lisinopril for many years and also glipizide, had developed angioedema, both the glipizide and lisinopril have been discontinued.  Since last office visit and making changes to his diabetes medications, states that his blood sugar is made much improved.  He has an appointment pending to see Dr. Elayne Snare for endocrinology.  Past Medical History:  Diagnosis Date  . Diabetes mellitus without complication ([place])   . DM 08/24/2010  . Hyperlipidemia   . MYALGIA 08/24/2010  . Palpitations   . Palpitations 09/07/2018  . Pure hypercholesterolemia 08/24/2018  . Uncontrolled type 2 diabetes mellitus without complication ([place]) 11/19/84   Qualifier: Diagnosis of  By: Loanne Drilling MD, Hilliard Clark A     History reviewed. No pertinent surgical history.  Social History   Socioeconomic History  . Marital status: Married    Spouse name: Not on file  . Number of children: 0  . Years of education: Not on file  . Highest education level: Not on file  Occupational History    Comment: Retired  Scientific laboratory technician  . Financial resource strain: Not on file  . Food insecurity:    Worry: Not on file    Inability: Not on file  . Transportation needs:    Medical: Not on file    Non-medical: Not on file  Tobacco Use  . Smoking status: Never Smoker  . Smokeless tobacco: Never Used  Substance and Sexual Activity   . Alcohol use: Not Currently  . Drug use: Never  . Sexual activity: Not on file  Lifestyle  . Physical activity:    Days per week: Not on file    Minutes per session: Not on file  . Stress: Not on file  Relationships  . Social connections:    Talks on phone: Not on file    Gets together: Not on file    Attends religious service: Not on file    Active member of club or organization: Not on file    Attends meetings of clubs or organizations: Not on file    Relationship status: Not on file  . Intimate partner violence:    Fear of current or ex partner: Not on file    Emotionally abused: Not on file    Physically abused: Not on file    Forced sexual activity: Not on file  Other Topics Concern  . Not on file  Social History Narrative   ** Merged History Encounter **       Regular exercise-yes   Works Real Estate   Originally from [place]    Current Outpatient Medications on File Prior to Visit  Medication Sig Dispense Refill  . aspirin 81 MG chewable tablet Chew 81 mg by mouth daily.    Marland Kitchen atorvastatin (LIPITOR) 20 MG tablet Take 20 mg by mouth daily.    . Saxagliptin-Metformin (  KOMBIGLYZE XR) 2.11-998 MG TB24 Take by mouth 2 (two) times daily.     Marland Kitchen aspirin 81 MG tablet Take 81 mg by mouth daily.    Marland Kitchen glipiZIDE (GLUCOTROL) 5 MG tablet Take 5 mg by mouth 2 (two) times daily before a meal.    . metFORMIN (GLUCOPHAGE) 1000 MG tablet 2 tablets by mouth once daily      No current facility-administered medications on file prior to visit.    Review of Systems  Constitutional: Negative for malaise/fatigue and weight loss.  Respiratory: Negative for cough, hemoptysis and shortness of breath.   Cardiovascular: Negative for chest pain, palpitations, claudication and leg swelling.  Gastrointestinal: Negative for abdominal pain, blood in stool, constipation, heartburn and vomiting.  Genitourinary: Negative for dysuria.  Musculoskeletal: Negative for joint pain and myalgias.   Neurological: Negative for dizziness, focal weakness and headaches.  Endo/Heme/Allergies: Does not bruise/bleed easily.  Psychiatric/Behavioral: Negative for depression. The patient is not nervous/anxious.   All other systems reviewed and are negative.   CARDIAC STUDIES:  LE arterial duplex 04/05/11: Normal ABI, mild plaque without significant stenosis  Nuclear stress 03/19/11: Exercise 6 minutes and 10 METs. Dyspnea. Mild apical thinning. No ischemia.  Recent Labs:   12/24/2013: HbA1c  9.7%.  12/12/2012: Free testosterone 6.5%, mildly reduced (7.2 to 24.0). Total serum testosterone 346, minimally decreased [phone]).  NMR LipoProfile 12/12/2012: Total cholesterol 169, triglycerides 99, HDL 39. LDL 110 mg. LDL particle number moderately elevated at 1345. LP-IR score mildly elevated at 61, suggests insulin resistance. CMP was within normal limits. TSH normal, vitamin D normal.        Objective:  Blood pressure 125/66, pulse 77, height 5' 11" (1.803 m), weight 222 lb 12.8 oz (101.1 kg), SpO2 99 %. Body mass index is 31.07 kg/m.  Physical Exam  Constitutional: He appears well-developed. No distress.  Moderately obese  HENT:  Head: Atraumatic.  Eyes: Conjunctivae are normal.  Neck: Neck supple. No JVD present. No thyromegaly present.  Cardiovascular: Normal rate, regular rhythm, normal heart sounds and intact distal pulses. Exam reveals no gallop.  No murmur heard. Pulmonary/Chest: Effort normal and breath sounds normal.  Abdominal: Soft. Bowel sounds are normal.  Musculoskeletal: Normal range of motion.        General: No edema.  Neurological: He is alert.  Skin: Skin is warm and dry.  Psychiatric: He has a normal mood and affect.   Assessment & Recommendations:  1. Hyperlipidemia  2. Uncontrolled DM without complications  3. 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 Terminated with carotid massage per patient.  EKG 09/07/18/20: Normal sinus rhythm at the rate of 63 bpm,  normal axis, poor R-wave progression, probably normal variant.  Normal QT interval.  4. Moderate Obesity  Laboratory exam: Exercise Myoview stress test 09/13/2018: No evidence of ischemia, normal LVEF at 72%.  Patient exercised for 7 minutes and achieved 8.4 METS.  Labs 09/04/2018: A1c 9.7%.  CBC normal, serum glucose 169 mg, BUN 28, creatinine 1.1, eGFR greater than 60 mL, potassium 5.6.  Total cholesterol 212, triglycerides 115, HDL 41, LDL 148.  Recommendation: He is here on annual visit and follow-up of palpitations, symptoms or remained stable and he has not had any further rapid palpitations.   Lipids are being managed by his PCP, recently started on Lipitor after his recent complete physical and labs were reviewed. Diabetes has been uncontrolled, and now therapy changes was done recently and patient reports improved blood glucose on home monitoring. Referral to endocrinology was made  to Dr. Elayne Snare and he will be seeing him next month.   I also reviewed his recently performed stress test, low risk stress test.  From cardiac standpoint he remained stable, no changes in the medications were done today. I'll see him back on annual basis. This is a 25 minute office visit with evaluation of his labs performed by his PCP and stress test and answering the questions related to the stress test and cardiac risk factors.  Adrian Prows, MD, [facility] 10/09/2018, 10:24 AM [facility] [place] Pager: [phone] Office: [phone] If no answer Cell [phone]

## 2018-10-09 ENCOUNTER — Ambulatory Visit (INDEPENDENT_AMBULATORY_CARE_PROVIDER_SITE_OTHER): Payer: Medicare Other | Admitting: Cardiology

## 2018-10-09 ENCOUNTER — Encounter: Payer: Self-pay | Admitting: Cardiology

## 2018-10-09 ENCOUNTER — Other Ambulatory Visit: Payer: Self-pay

## 2018-10-09 VITALS — BP 125/66 | HR 77 | Ht 71.0 in | Wt 222.8 lb

## 2018-10-09 DIAGNOSIS — E7800 Pure hypercholesterolemia, unspecified: Secondary | ICD-10-CM | POA: Diagnosis not present

## 2018-10-09 DIAGNOSIS — IMO0001 Reserved for inherently not codable concepts without codable children: Secondary | ICD-10-CM

## 2018-10-09 DIAGNOSIS — E1165 Type 2 diabetes mellitus with hyperglycemia: Secondary | ICD-10-CM

## 2018-10-09 DIAGNOSIS — Z8679 Personal history of other diseases of the circulatory system: Secondary | ICD-10-CM | POA: Diagnosis not present

## 2018-10-25 ENCOUNTER — Ambulatory Visit: Payer: Medicare Other | Admitting: Endocrinology

## 2018-11-29 ENCOUNTER — Other Ambulatory Visit: Payer: Self-pay | Admitting: Endocrinology

## 2018-11-29 ENCOUNTER — Other Ambulatory Visit: Payer: Self-pay

## 2018-11-29 ENCOUNTER — Ambulatory Visit: Payer: Medicare Other | Admitting: Endocrinology

## 2018-11-29 ENCOUNTER — Other Ambulatory Visit (INDEPENDENT_AMBULATORY_CARE_PROVIDER_SITE_OTHER): Payer: Medicare Other

## 2018-11-29 DIAGNOSIS — E1165 Type 2 diabetes mellitus with hyperglycemia: Secondary | ICD-10-CM

## 2018-11-29 LAB — COMPREHENSIVE METABOLIC PANEL
ALT: 16 U/L (ref 0–53)
AST: 13 U/L (ref 0–37)
Albumin: 3.9 g/dL (ref 3.5–5.2)
Alkaline Phosphatase: 58 U/L (ref 39–117)
BUN: 23 mg/dL (ref 6–23)
CO2: 28 mEq/L (ref 19–32)
Calcium: 9.4 mg/dL (ref 8.4–10.5)
Chloride: 102 mEq/L (ref 96–112)
Creatinine, Ser: 0.97 mg/dL (ref 0.40–1.50)
GFR: 76.97 mL/min (ref >60.00)
Glucose, Bld: 132 mg/dL — ABNORMAL HIGH (ref 70–99)
Potassium: 4.2 mEq/L (ref 3.5–5.1)
Sodium: 138 mEq/L (ref 135–145)
Total Bilirubin: 0.3 mg/dL (ref 0.2–1.2)
Total Protein: 7.6 g/dL (ref 6.0–8.3)

## 2018-11-29 LAB — HEMOGLOBIN A1C: Hgb A1c MFr Bld: 7.3 % — ABNORMAL HIGH (ref 4.6–6.5)

## 2018-11-29 NOTE — Progress Notes (Signed)
Patient ID: Alexander Mckay, male   DOB: [birth year], 68 y.o.   MRN: [id number]           Reason for Appointment: Consultation for Type 2 Diabetes  Referring physician: Dr. Jacinto Halim   History of Present Illness:          Date of diagnosis of type 2 diabetes mellitus: About 2002      Background history:  He thinks his diabetes has been mild previously and was controlled with metformin for several years No records are available from his treating physician  Recent history:   Most recent A1c is 7.3 done on 11/29/2018, previously 9.7    Non-insulin hypoglycemic drugs the patient is taking are: Glipizide 5 mg at bedtime, Kombiglyze XR twice daily  Current management, blood sugar patterns and problems identified:  Although he thinks his blood sugars were 180-200s a couple of months ago they have been relatively better now  Reportedly had an A1c in February from PCP which was 9.7 and he thinks this was related to his traveling to his home country and eating poorly including sweets at that time  More recently he thinks he has been losing weight with being more consistent with diet  Also trying to exercise regularly with his stationary bike at home   His sugars have been somewhat variable, checking with the generic monitor  However today about 3 and half hours after breakfast his blood sugar is 151/110 5 fasting this morning at home        Side effects from medications have been: None      Typical meal intake: Breakfast is eggs and toast or oatmeal.  Avoiding fried food and regular soft drinks               Exercise:  Up to 45 minutes daily on stationary bike  Glucose monitoring:  done 1 times a day         Glucometer:  Reli[employer].       Blood Glucose readings by time of day from home record:  PREMEAL Breakfast Lunch Dinner Bedtime  Overall   Glucose range: 91-156      Median:        No readings after meals  Dietician visit, most recent:  Weight history:260  Wt Readings  from Last 3 Encounters:  11/30/18 217 lb 9.6 oz (98.7 kg)  10/09/18 222 lb 12.8 oz (101.1 kg)  09/07/18 226 lb (102.5 kg)    Glycemic control:   Lab Results  Component Value Date   HGBA1C 7.3 (H) 11/29/2018   HGBA1C 7.6 (H) 11/25/2010   HGBA1C 8.0 (H) 08/24/2010   Lab Results  Component Value Date   CREATININE 0.97 11/29/2018   No results found for: MICRALBCREAT  No results found for: FRUCTOSAMINE  Office Visit on 11/30/2018  Component Date Value Ref Range Status   POC Glucose 11/30/2018 150* 70 - 99 mg/dl Final  Lab on 64/40/3474  Component Date Value Ref Range Status   Sodium 11/29/2018 138  135 - 145 mEq/L Final   Potassium 11/29/2018 4.2  3.5 - 5.1 mEq/L Final   Chloride 11/29/2018 102  96 - 112 mEq/L Final   CO2 11/29/2018 28  19 - 32 mEq/L Final   Glucose, Bld 11/29/2018 132* 70 - 99 mg/dL Final   BUN 25/95/6387 23  6 - 23 mg/dL Final   Creatinine, Ser 11/29/2018 0.97  0.40 - 1.50 mg/dL Final   Total Bilirubin 11/29/2018 0.3  0.2 - 1.2 mg/dL Final  Alkaline Phosphatase 11/29/2018 58  39 - 117 U/L Final   AST 11/29/2018 13  0 - 37 U/L Final   ALT 11/29/2018 16  0 - 53 U/L Final   Total Protein 11/29/2018 7.6  6.0 - 8.3 g/dL Final   Albumin 16/04/9603 3.9  3.5 - 5.2 g/dL Final   Calcium 54/03/8118 9.4  8.4 - 10.5 mg/dL Final   GFR 14/78/2956 76.97  >60.00 mL/min Final   Hgb A1c MFr Bld 11/29/2018 7.3* 4.6 - 6.5 % Final   Glycemic Control Guidelines for People with Diabetes:Non Diabetic:  <6%Goal of Therapy: <7%Additional Action Suggested:  >8%     Allergies as of 11/30/2018      Reactions   Lisinopril Swelling   Glipizide    Lip swelling       Medication List       Accurate as of Nov 30, 2018 12:44 PM. If you have any questions, ask your nurse or doctor.        STOP taking these medications   atorvastatin 20 MG tablet Commonly known as:  LIPITOR Stopped by:  Reather Littler, MD   metFORMIN 1000 MG tablet Commonly known as:   GLUCOPHAGE Stopped by:  Reather Littler, MD     TAKE these medications   aspirin 81 MG chewable tablet Chew 81 mg by mouth daily.   canagliflozin 100 MG Tabs tablet Commonly known as:  Invokana 1 tablet before breakfast Started by:  Reather Littler, MD   glipiZIDE 5 MG tablet Commonly known as:  GLUCOTROL Take 5 mg by mouth at bedtime. TAKE 1 TABLET BY MOUTH ONCE DAILY AT BEDTIME.   Kombiglyze XR 2.11-998 MG Tb24 Generic drug:  Saxagliptin-Metformin Take by mouth 2 (two) times daily. Take 1 tablet by mouth in the morning and 1 tablet in the evening.  Allergies:  Allergies  Allergen Reactions   Lisinopril Swelling   Glipizide     Lip swelling     Past Medical History:  Diagnosis Date   Diabetes mellitus without complication (HCC)    DM 08/24/2010   Hyperlipidemia    MYALGIA 08/24/2010   Palpitations    Palpitations 09/07/2018   Pure hypercholesterolemia 08/24/2018   Uncontrolled type 2 diabetes mellitus without complication (HCC) 08/24/2010   Qualifier: Diagnosis of  By: Everardo AllEllison MD, Gregary SignsSean A     History reviewed. No pertinent surgical history.  Family History  Problem Relation Age of Onset   Cancer Mother        Ovarian Cancer   Heart attack Father    Diabetes Sister    Diabetes Brother    Diabetes Sister     Social History:  reports that he has never smoked. He has never used smokeless tobacco. He reports previous alcohol use. He reports that he does not use drugs.   Review of Systems  Constitutional: Positive for weight loss.  HENT: Negative for headaches.   Eyes: Negative for visual disturbance.  Respiratory: Negative for shortness of breath.   Cardiovascular: Negative for leg swelling.  Gastrointestinal: Negative for constipation and diarrhea.  Endocrine: Negative for fatigue.  Genitourinary: Negative for frequency.  Musculoskeletal: Negative for joint pain.  Skin: Negative for rash.  Neurological:       He has some pains in his lower legs and feet along with feeling of stiffness, no significant numbness or burning  Psychiatric/Behavioral: Negative for insomnia.     Lipid history: His last LDL was 148 He was recommended Lipitor 20 mg by his PCP but even though he has the prescription he has been reluctant to add another medication.   No results found for: CHOL, HDL, LDLCALC, LDLDIRECT, TRIG, CHOLHDL         Hypertension: Has been present  BP Readings from Last 3 Encounters:  11/30/18 120/70  10/09/18 125/66  09/07/18 132/81    Most recent eye exam was in 09/2018  Most recent foot  exam: 5/20  Currently known complications of diabetes: Peripheral neuropathy  LABS:  Office Visit on 11/30/2018  Component Date Value Ref Range Status   POC Glucose 11/30/2018 150* 70 - 99 mg/dl Final  Lab on 24/40/102705/13/2020  Component Date Value Ref Range Status   Sodium 11/29/2018 138  135 - 145 mEq/L Final   Potassium 11/29/2018 4.2  3.5 - 5.1 mEq/L Final   Chloride 11/29/2018 102  96 - 112 mEq/L Final   CO2 11/29/2018 28  19 - 32 mEq/L Final   Glucose, Bld 11/29/2018 132* 70 - 99 mg/dL Final   BUN 25/36/644005/13/2020 23  6 - 23 mg/dL Final   Creatinine, Ser 11/29/2018 0.97  0.40 - 1.50 mg/dL Final   Total Bilirubin 11/29/2018 0.3  0.2 - 1.2 mg/dL Final   Alkaline Phosphatase 11/29/2018 58  39 - 117 U/L Final   AST 11/29/2018 13  0 - 37 U/L Final   ALT 11/29/2018 16  0 - 53 U/L Final   Total Protein 11/29/2018 7.6  6.0 - 8.3 g/dL Final   Albumin 34/74/259505/13/2020 3.9  3.5 - 5.2 g/dL Final   Calcium 63/87/564305/13/2020 9.4  8.4 - 10.5 mg/dL Final   GFR 32/95/188405/13/2020 76.97  >60.00 mL/min Final   Hgb A1c MFr Bld 11/29/2018 7.3* 4.6 - 6.5 % Final   Glycemic Control Guidelines for People with Diabetes:Non Diabetic:  <6%Goal of Therapy: <7%Additional Action Suggested:  >  8%     Physical Examination:  BP 120/70 (BP Location: Left Arm, Patient Position: Sitting, Cuff Size: Large)    Pulse 70    Ht 5\' 11"  (1.803 m)    Wt 217 lb 9.6 oz (98.7 kg)    SpO2 95%    BMI 30.35 kg/m   GENERAL:         Patient has abdominal obesity.    HEENT:         Eye exam shows normal external appearance.  Fundus exam deferred to ophthalmologist  NECK:   There is no lymphadenopathy  Thyroid is not enlarged and no nodules felt.    LUNGS:         Chest is symmetrical. Lungs are clear to auscultation.Marland Kitchen   HEART:         Heart sounds:  S1 and S2 are normal. No murmur or click heard., no S3 or S4.   ABDOMEN:  Exam not indicated    NEUROLOGICAL:   Ankle jerks are absent bilaterally.    Diabetic Foot Exam - Simple    Simple Foot Form Diabetic Foot exam was performed with the following findings:  Yes   Visual Inspection No deformities, no ulcerations, no other skin breakdown bilaterally:  Yes Sensation Testing Intact to touch and monofilament testing bilaterally:  Yes Pulse Check See comments:  Yes Comments Dorsalis pedis pulses not palpable, posterior tibialis pulses 3+ on the right and 2+ on the left            Vibration sense is absent in distal first toes.  MUSCULOSKELETAL:  There is no swelling or deformity of the peripheral joints.     EXTREMITIES:     There is no ankle edema.  SKIN:    Has minimal eczematous changes on the left foot      ASSESSMENT:  Diabetes type 2  See history of present illness for detailed discussion of current diabetes management, blood sugar patterns and problems identified  Recent A1c indicates relatively better control but still at 7.3 is not at target  Current treatment regimen is glipizide and Kombiglyze XR Currently not on any diabetes medication that could provide reduced cardiovascular risk despite his multiple risk factors including family history of CAD  Complications of diabetes: Peripheral neuropathy with mild symptoms, objective signs are mostly related to absent vibration sense  HYPERLIPIDEMIA: Currently untreated with baseline LDL 148 He does not understand the importance of risk reduction with patients with diabetes with statin drugs and this was discussed  PLAN:    1. Glucose monitoring: He will be given a One Touch very monitor that is more accurate and can be downloaded  Patient advised to check readings either fasting or 2 hours after meals once a day  2.  Diabetes education:  Currently not needed but may consider nutritional consultation   3.  Lifestyle management: We will continue regular exercise as before and cut back on high carbohydrate and high fat foods consistently  4.  Medication changes needed: Discussed action of SGLT 2  drugs on lowering glucose by decreasing kidney absorption of glucose, benefits of weight loss and lower blood pressure, possible side effects, cardiovascular benefits and dosage regimen   He will start with 100 mg of Invokana in the morning  At the same time he will stop his glipizide  Encouraged him to increase his fluid intake  RESTART atorvastatin for hyperlipidemia that he has at home Follow-up lipids on the next visit  5.  Preventive care:  Given him handout on foot care for people with diabetes  6.  Follow-up: 6 weeks with labs    Patient Instructions  Check blood sugars on waking up 3 days a week  Also check blood sugars about 2 hours after meals and do this after different meals by rotation  Recommended blood sugar levels on waking up are 90-130 and about 2 hours after meal is 130-160  Please bring your blood sugar monitor to each visit, thank you  DIABETES medications:  Stop taking glipizide with starting [facility] Needs to be taken before breakfast daily Initially may notice increased urination during the day and recommend increasing your fluid intake  Start taking ATORVASTATIN prescribed by family doctor for cholesterol and heart risk reduction    Consultation note has been sent to the referring physician  Counseling time on subjects discussed in assessment and plan sections is over 50% of today's 45 minute visit   Reather Littler 11/30/2018, 12:44 PM   Note: This office note was prepared with Dragon voice recognition system technology. Any transcriptional errors that result from this process are unintentional.

## 2018-11-30 ENCOUNTER — Encounter: Payer: Self-pay | Admitting: Endocrinology

## 2018-11-30 ENCOUNTER — Other Ambulatory Visit: Payer: Self-pay

## 2018-11-30 ENCOUNTER — Ambulatory Visit (INDEPENDENT_AMBULATORY_CARE_PROVIDER_SITE_OTHER): Payer: Medicare Other | Admitting: Endocrinology

## 2018-11-30 VITALS — BP 120/70 | HR 70 | Ht 71.0 in | Wt 217.6 lb

## 2018-11-30 DIAGNOSIS — E782 Mixed hyperlipidemia: Secondary | ICD-10-CM | POA: Diagnosis not present

## 2018-11-30 DIAGNOSIS — E1165 Type 2 diabetes mellitus with hyperglycemia: Secondary | ICD-10-CM

## 2018-11-30 LAB — GLUCOSE, POCT (MANUAL RESULT ENTRY): POC Glucose: 150 mg/dl — AB (ref 70–99)

## 2018-11-30 MED ORDER — CANAGLIFLOZIN 100 MG PO TABS: ORAL_TABLET | ORAL | 3 refills | Status: DC

## 2018-11-30 NOTE — Patient Instructions (Addendum)
Check blood sugars on waking up 3 days a week  Also check blood sugars about 2 hours after meals and do this after different meals by rotation  Recommended blood sugar levels on waking up are 90-130 and about 2 hours after meal is 130-160  Please bring your blood sugar monitor to each visit, thank you  DIABETES medications:  Stop taking glipizide with starting [facility] Needs to be taken before breakfast daily Initially may notice increased urination during the day and recommend increasing your fluid intake  Start taking ATORVASTATIN prescribed by family doctor for cholesterol and heart risk reduction

## 2018-12-29 ENCOUNTER — Other Ambulatory Visit: Payer: Medicare Other

## 2019-01-03 ENCOUNTER — Ambulatory Visit: Payer: Medicare Other | Admitting: Endocrinology

## 2019-10-10 ENCOUNTER — Ambulatory Visit: Payer: Medicare Other | Admitting: Cardiology

## 2019-10-12 ENCOUNTER — Other Ambulatory Visit: Payer: Self-pay

## 2019-10-12 ENCOUNTER — Encounter: Payer: Self-pay | Admitting: Cardiology

## 2019-10-12 ENCOUNTER — Ambulatory Visit: Payer: Medicare HMO | Admitting: Cardiology

## 2019-10-12 VITALS — BP 125/65 | HR 80 | Temp 97.0°F | Resp 16 | Ht 71.0 in | Wt 223.6 lb

## 2019-10-12 DIAGNOSIS — E119 Type 2 diabetes mellitus without complications: Secondary | ICD-10-CM

## 2019-10-12 DIAGNOSIS — E7800 Pure hypercholesterolemia, unspecified: Secondary | ICD-10-CM

## 2019-10-12 DIAGNOSIS — Z8679 Personal history of other diseases of the circulatory system: Secondary | ICD-10-CM

## 2019-10-12 NOTE — Progress Notes (Signed)
Subjective:   '@Patient'$  ID: Alexander Mckay, male    DOB: 09/07/1950, 69 y.o.   MRN: [id number]  Chief Complaint  Patient presents with  . Hyperlipidemia  . Tachycardia  . Follow-up    1 year   HPI   Mr. Alexander Mckay is a pleasant gentleman from [place] originally. He has uncontrolled diabetes, hyperlipidemia.  He has h/o SVT per history many years ago and no recurrence of SVT over the last few years. He comes   Patient denies any symptoms of claudication, TIA. He denies any chest pain or shortness of breath. No dizziness or syncope. Since last office visit and making changes to his diabetes medications, states that his blood sugar is made much improved.     Past Medical History:  Diagnosis Date  . Diabetes mellitus without complication ([place])   . DM 08/24/2010  . Hyperlipidemia   . MYALGIA 08/24/2010  . Palpitations   . Palpitations 09/07/2018  . Pure hypercholesterolemia 08/24/2018  . Uncontrolled type 2 diabetes mellitus without complication 08/27/7679   Qualifier: Diagnosis of  By: Loanne Drilling MD, Hilliard Clark A    History reviewed. No pertinent surgical history.  Social History   Tobacco Use  . Smoking status: Never Smoker  . Smokeless tobacco: Never Used  Substance Use Topics  . Alcohol use: Not Currently     Current Outpatient Medications on File Prior to Visit  Medication Sig Dispense Refill  . aspirin 81 MG chewable tablet Chew 81 mg by mouth daily.    Marland Kitchen glipiZIDE (GLUCOTROL) 5 MG tablet Take 5 mg by mouth daily before breakfast.    . Saxagliptin-Metformin (KOMBIGLYZE XR) 2.11-998 MG TB24 Take by mouth 2 (two) times daily. Take 1 tablet by mouth in the morning and 1 tablet in the evening.     No current facility-administered medications on file prior to visit.   Review of Systems  Cardiovascular: Negative.   Gastrointestinal: Negative.   Neurological: Negative.     Objective:  Blood pressure 125/65, pulse 80, temperature (!) 97 F (36.1 C), temperature source  Temporal, resp. rate 16, height '5\' 11"'$  (1.803 m), weight 223 lb 9.6 oz (101.4 kg), SpO2 95 %. Body mass index is 31.19 kg/m.  Physical Exam  Constitutional: He appears well-developed. No distress.  Moderately obese  Neck: No JVD present.  Cardiovascular: Normal rate, regular rhythm, normal heart sounds and intact distal pulses. Exam reveals no gallop.  No murmur heard. Pulmonary/Chest: Effort normal and breath sounds normal.  Abdominal: Soft. Bowel sounds are normal.  Musculoskeletal:        General: No edema.  Psychiatric: He has a normal mood and affect.   Labs   A1C 7.300 11/29/2018  Creatinine, Serum 0.970 11/29/2018  Potassium 4.200 11/29/2018 ALT (SGPT) 16.000 5/13/20201C 7.300 11/29/2018  Creatinine, Serum 0.970 11/29/2018  Potassium 4.200 11/29/2018 ALT (SGPT) 16.000 11/29/2018  09/04/2018: A1c 9.7%.  CBC normal, serum glucose 169 mg, BUN 28, creatinine 1.1, eGFR greater than 60 mL, potassium 5.6.  Total cholesterol 212, triglycerides 115, HDL 41, LDL 148.  CARDIAC STUDIES:  LE arterial duplex 04/05/11: Normal ABI, mild plaque without significant stenosis  Exercise Myoview stress test 09/13/2018: No evidence of ischemia, normal LVEF at 72%.  Patient exercised for 7 minutes and achieved 8.4 METS.  EKG 10/12/2019: Normal sinus rhythm with rate of 76 bpm, left atrial enlargement, normal axis.  No evidence of ischemia, normal EKG. no significant change from 09/07/2018.  Assessment & Recommendations:     ICD-10-CM  1. H/O paroxysmal supraventricular tachycardia  Z86.79 EKG 12-Lead  2. Pure hypercholesterolemia  E78.00   3. Controlled type 2 diabetes mellitus without complication, without long-term current use of insulin (HCC)  E11.9     Recommendation: Patient with remote PSVT, has not had any further episodes, uncontrolled diabetes mellitus but now much improved with A1c approaching 7.0, reviewed his recent labs, hyperlipidemia, lipids also controlled presents here for  annual visit, he is asymptomatic from cardiac standpoint, continue primary prevention.  In view of his diabetes, I would highly recommend that patient be on an ARB.  He had developed angioedema on ACE inhibitor's.  We will request his PCP to take over.  He also has mild hyperlipidemia, again in view of diabetic state, would again recommend a low-dose high intensity statin for primary prevention.  Goal LDL in a diabetic patient being 70 or less he would certainly benefit from being on a statin.  I will see him back on a as needed basis.  Long discussion regarding primary prevention with the patient.  Adrian Prows, MD, [facility] 10/12/2019, 2:19 PM [facility] [place] Office: [phone]

## 2020-10-17 ENCOUNTER — Ambulatory Visit: Payer: Medicare HMO | Admitting: Cardiology

## 2020-10-17 NOTE — Progress Notes (Deleted)
Primary Physician/Referring:  Casilda Carls, MD  Patient ID: Alexander Mckay, male    DOB: Apr 13, 1951, 70 y.o.   MRN: [id number]  No chief complaint on file.  HPI:    Alexander Mckay  is a 70 y.o. pleasant gentleman from [place] originally. He has uncontrolled diabetes, hyperlipidemia.  He has h/o SVT per history many years ago and no recurrence of SVT over the last few years.   ***Patient was last seen in our office 10/12/2019 by Dr. Einar Gip at which time he was stable from a cardiovascular standpoint and recommended for as needed follow-up with primary prevention management by PCP.  Patient now comes in for follow-up of pure hypercholesterolemia.  ***  Patient denies any symptoms of claudication, TIA. He denies any chest pain or shortness of breath. No dizziness or syncope. Since last office visit and making changes to his diabetes medications, states that his blood sugar is made much improved.     Past Medical History:  Diagnosis Date  . Diabetes mellitus without complication ([place])   . DM 08/24/2010  . Hyperlipidemia   . MYALGIA 08/24/2010  . Palpitations   . Palpitations 09/07/2018  . Pure hypercholesterolemia 08/24/2018  . Uncontrolled type 2 diabetes mellitus without complication 08/23/9561   Qualifier: Diagnosis of  By: Loanne Drilling MD, Sean A    No past surgical history on file. Family History  Problem Relation Age of Onset  . Cancer Mother        Ovarian Cancer  . Heart attack Father   . Diabetes Sister   . Diabetes Brother   . Diabetes Sister     Social History   Tobacco Use  . Smoking status: Never Smoker  . Smokeless tobacco: Never Used  Substance Use Topics  . Alcohol use: Not Currently   Marital Status: Married   ROS  Review of Systems  Constitutional: Negative for malaise/fatigue and weight gain.  Cardiovascular: Negative for chest pain, claudication, leg swelling, near-syncope, orthopnea, palpitations, paroxysmal nocturnal dyspnea and syncope.  Respiratory:  Negative for shortness of breath.   Hematologic/Lymphatic: Does not bruise/bleed easily.  Gastrointestinal: Negative for melena.  Neurological: Negative for dizziness and weakness.    Objective  There were no vitals taken for this visit.  Vitals with BMI 10/12/2019 11/30/2018 10/09/2018  Height _0  _1  _2   Weight 223 lbs 10 oz 217 lbs 10 oz 222 lbs 13 oz  BMI 31.2 87.56 43.32  Systolic 951 884 166  Diastolic 65 70 66  Pulse 80 70 77      Physical Exam Vitals reviewed.  Constitutional:      Appearance: He is obese.  HENT:     Head: Normocephalic and atraumatic.  Cardiovascular:     Rate and Rhythm: Normal rate and regular rhythm.     Pulses: Intact distal pulses.     Heart sounds: S1 normal and S2 normal. No murmur heard. No gallop.   Pulmonary:     Effort: Pulmonary effort is normal. No respiratory distress.     Breath sounds: No wheezing, rhonchi or rales.  Musculoskeletal:     Right lower leg: No edema.     Left lower leg: No edema.  Neurological:     Mental Status: He is alert.     Laboratory examination:   No results for input(s): NA, K, CL, CO2, GLUCOSE, BUN, CREATININE, CALCIUM, GFRNONAA, GFRAA in the last 8760 hours. CrCl cannot be calculated (Patient's most recent lab result is older than the maximum 21 days  allowed.).  CMP Latest Ref Rng & Units 11/29/2018 08/24/2010  Glucose 70 - 99 mg/dL 132(H) 170(H)  BUN 6 - 23 mg/dL 23 15  Creatinine 0.40 - 1.50 mg/dL 0.97 0.9  Sodium 135 - 145 mEq/L 138 138  Potassium 3.5 - 5.1 mEq/L 4.2 4.7  Chloride 96 - 112 mEq/L 102 101  CO2 19 - 32 mEq/L 28 30  Calcium 8.4 - 10.5 mg/dL 9.4 9.2  Total Protein 6.0 - 8.3 g/dL 7.6 -  Total Bilirubin 0.2 - 1.2 mg/dL 0.3 -  Alkaline Phos 39 - 117 U/L 58 -  AST 0 - 37 U/L 13 -  ALT 0 - 53 U/L 16 -   No flowsheet data found.  Lipid Panel No results for input(s): CHOL, TRIG, LDLCALC, VLDL, HDL, CHOLHDL, LDLDIRECT in the last 8760 hours.  HEMOGLOBIN A1C Lab Results   Component Value Date   HGBA1C 7.3 (H) 11/29/2018   TSH No results for input(s): TSH in the last 8760 hours.  External labs:  11/29/2018: A1c 7.3% Creatinine 0.97, potassium 4.2, ALT 16  09/04/2018:  A1c 9.7%.   CBC normal,  serum glucose 169 mg, BUN 28, creatinine 1.1, eGFR greater than 60 mL, potassium 5.6.   Total cholesterol 212, triglycerides 115, HDL 41, LDL 148.  Medications and allergies   Allergies  Allergen Reactions  . Lisinopril Swelling  . Glipizide     Lip swelling      Outpatient Medications Prior to Visit  Medication Sig Dispense Refill  . aspirin 81 MG chewable tablet Chew 81 mg by mouth daily.    Marland Kitchen glipiZIDE (GLUCOTROL) 5 MG tablet Take 5 mg by mouth daily before breakfast.    . Saxagliptin-Metformin (KOMBIGLYZE XR) 2.11-998 MG TB24 Take by mouth 2 (two) times daily. Take 1 tablet by mouth in the morning and 1 tablet in the evening.     No facility-administered medications prior to visit.     Radiology:   No results found.  Cardiac Studies:    LE arterial duplex 04/05/11:  Normal ABI, mild plaque without significant stenosis  Exercise Myoview stress test 09/13/2018: No evidence of ischemia, normal LVEF at 72%.  Patient exercised for 7 minutes and achieved 8.4 METS.  EKG:   ***  EKG 10/12/2019: Normal sinus rhythm with rate of 76 bpm, left atrial enlargement, normal axis.  No evidence of ischemia, normal EKG. no significant change from 09/07/2018.  Assessment     ICD-10-CM   1. Pure hypercholesterolemia  E78.00   2. Controlled type 2 diabetes mellitus without complication, without long-term current use of insulin (HCC)  E11.9   3. H/O paroxysmal supraventricular tachycardia  Z86.79      There are no discontinued medications.  No orders of the defined types were placed in this encounter.   Recommendations:   Alexander Mckay is a 70 y.o. pleasant gentleman from [place] originally. He has uncontrolled diabetes, hyperlipidemia.  He has  h/o SVT per history many years ago and no recurrence of SVT over the last few years.   ***Patient was last seen in our office 10/12/2019 by Dr. Einar Gip at which time he was stable from a cardiovascular standpoint and recommended for as needed follow-up with primary prevention management by PCP.  Patient now comes in for follow-up of pure hypercholesterolemia.  ***  ***  Patient with remote PSVT, has not had any further episodes, uncontrolled diabetes mellitus but now much improved with A1c approaching 7.0, reviewed his recent labs, hyperlipidemia, lipids also controlled presents  here for annual visit, he is asymptomatic from cardiac standpoint, continue primary prevention.  In view of his diabetes, I would highly recommend that patient be on an ARB.  He had developed angioedema on ACE inhibitor's.  We will request his PCP to take over.  He also has mild hyperlipidemia, again in view of diabetic state, would again recommend a low-dose high intensity statin for primary prevention.  Goal LDL in a diabetic patient being 70 or less he would certainly benefit from being on a statin.  I will see him back on a as needed basis.  Long discussion regarding primary prevention with the patient.

## 2020-11-21 ENCOUNTER — Encounter: Payer: Self-pay | Admitting: Cardiology

## 2020-11-21 ENCOUNTER — Ambulatory Visit: Payer: Medicare HMO | Admitting: Cardiology

## 2020-11-21 ENCOUNTER — Other Ambulatory Visit: Payer: Self-pay

## 2020-11-21 VITALS — BP 114/71 | HR 89 | Temp 98.1°F | Resp 16 | Ht 71.0 in | Wt 210.6 lb

## 2020-12-05 ENCOUNTER — Ambulatory Visit: Payer: Medicare HMO | Admitting: Cardiology

## 2020-12-08 NOTE — Progress Notes (Signed)
OV cancelled due to hospital emergency JG

## 2020-12-09 ENCOUNTER — Ambulatory Visit: Payer: Medicare HMO | Admitting: Cardiology

## 2020-12-09 ENCOUNTER — Encounter: Payer: Self-pay | Admitting: Cardiology

## 2020-12-09 ENCOUNTER — Other Ambulatory Visit: Payer: Self-pay

## 2020-12-09 VITALS — BP 100/65 | HR 81 | Temp 97.6°F | Resp 16 | Ht 71.0 in | Wt 208.8 lb

## 2020-12-09 DIAGNOSIS — E1165 Type 2 diabetes mellitus with hyperglycemia: Secondary | ICD-10-CM

## 2020-12-09 DIAGNOSIS — E119 Type 2 diabetes mellitus without complications: Secondary | ICD-10-CM

## 2020-12-09 DIAGNOSIS — E7800 Pure hypercholesterolemia, unspecified: Secondary | ICD-10-CM

## 2020-12-09 DIAGNOSIS — R931 Abnormal findings on diagnostic imaging of heart and coronary circulation: Secondary | ICD-10-CM

## 2020-12-09 DIAGNOSIS — Z8679 Personal history of other diseases of the circulatory system: Secondary | ICD-10-CM

## 2020-12-09 NOTE — Progress Notes (Signed)
Subjective:   @Patient  ID: , male    DOB: 1951-04-17, 70 y.o.   MRN: [id number]  Chief Complaint  Patient presents with  . Hyperlipidemia  . Follow-up    1 year   HPI   Mr. Alexander Mckay is a 70 y.o. male patient from [place]. He has uncontrolled diabetes, hyperlipidemia, h/o SVT many years ago and no recurrence of SVT over the last few years. He comes for annual visit, wants to discuss primary prevention strategies.  Is presently asymptomatic but does admit to being extremely sedentary.  I have discussed with him regarding primary prevention.  He is concerned about one of his friends having had massive myocardial infarction and cardiac arrest in spite of "being healthy".  Patient denies any symptoms of claudication, TIA. He denies any chest pain or shortness of breath. No dizziness or syncope. Since last office visit and making changes to his diabetes medications, states that his blood sugar is made much improved since being on Synjardy.     Past Medical History:  Diagnosis Date  . Diabetes mellitus without complication (HCC)   . DM 08/24/2010  . Hyperlipidemia   . MYALGIA 08/24/2010  . Palpitations   . Palpitations 09/07/2018  . Pure hypercholesterolemia 08/24/2018  . Uncontrolled type 2 diabetes mellitus without complication 08/24/2010   Qualifier: Diagnosis of  By: 10/23/2010 MD, Everardo All A    History reviewed. No pertinent surgical history.  Social History   Tobacco Use  . Smoking status: Never Smoker  . Smokeless tobacco: Never Used  Substance Use Topics  . Alcohol use: Not Currently    Review of Systems  Respiratory: Negative for shortness of breath.   Cardiovascular: Negative.  Negative for chest pain, palpitations, claudication and leg swelling.  Gastrointestinal: Negative.   Genitourinary: Positive for dysuria.  Neurological: Negative for focal weakness.  All other systems reviewed and are negative.   Objective:  Blood pressure 100/65, pulse 81,  temperature 97.6 F (36.4 C), temperature source Temporal, resp. rate 16, height 5\' 11"  (1.803 m), weight 208 lb 12.8 oz (94.7 kg), SpO2 97 %. Body mass index is 29.12 kg/m.   Vitals with BMI 12/09/2020 11/21/2020 10/12/2019  Height 5\' 11"  5\' 11"  5\' 11"   Weight 208 lbs 13 oz 210 lbs 10 oz 223 lbs 10 oz  BMI 29.13 29.39 31.2  Systolic 100 114 01/21/2021  Diastolic 65 71 65  Pulse 81 89 80    Physical Exam Constitutional:      General: He is not in acute distress.    Appearance: He is well-developed.     Comments: Moderately obese  Neck:     Vascular: No JVD.  Cardiovascular:     Rate and Rhythm: Normal rate and regular rhythm.     Pulses: Intact distal pulses.     Heart sounds: Normal heart sounds. No murmur heard. No gallop.   Pulmonary:     Effort: Pulmonary effort is normal.     Breath sounds: Normal breath sounds.  Abdominal:     General: Bowel sounds are normal.     Palpations: Abdomen is soft.     Allergies  Allergen Reactions  . Lisinopril Swelling  . Glipizide     Lip swelling     Current Outpatient Medications on File Prior to Visit  Medication Sig Dispense Refill  . Empagliflozin-metFORMIN HCl ER (SYNJARDY XR) 12.11-998 MG TB24 Take 1 tablet by mouth 2 (two) times daily.    . Saxagliptin-Metformin 2.11-998 MG TB24 Take  by mouth 2 (two) times daily. Take 1 tablet by mouth in the morning and 1 tablet in the evening.    . Semaglutide (RYBELSUS) 3 MG TABS Take 1 tablet by mouth daily.     No current facility-administered medications on file prior to visit.    Labs   Labs 10/06/2020:  Serum glucose 274, BUN 21, creatinine 1.09, EGFR 73 mL, potassium 3.9.  A1c 11.0%.  A1C 7.300 11/29/2018  Creatinine, Serum 0.970 11/29/2018  Potassium 4.200 11/29/2018 ALT (SGPT) 16.000 5/13/20201C 7.300 11/29/2018  Creatinine, Serum 0.970 11/29/2018  Potassium 4.200 11/29/2018 ALT (SGPT) 16.000 11/29/2018  09/04/2018: A1c 9.7%.  CBC normal, serum glucose 169 mg, BUN 28, creatinine  1.1, eGFR greater than 60 mL, potassium 5.6.  Total cholesterol 212, triglycerides 115, HDL 41, LDL 148.  CARDIAC STUDIES:  LE arterial duplex 04/05/11: Normal ABI, mild plaque without significant stenosis  External Review: Exercise Myoview stress test 09/13/2018: No evidence of ischemia, normal LVEF at 72%.  Patient exercised for 7 minutes and achieved 8.4 METS.  Coronary calcium score 12/10/2020: 1. Total Angstrom coronary calcium score of 598, MeSA database percentile 92. Left main: 0 LAD: 375 Left circumflex: 132 RCA: 91. 2. Aortic valvular calcification without evidence of visible aneurysmal dilatation of the visualized ascending thoracic aorta. Correlation with echocardiography may be helpful. 3. Atherosclerosis of the descending thoracic aorta.  EKG:   EKG 11/21/2020: Normal sinus rhythm at the rate of 80 bpm, normal axis, poor R progression, probably normal variant.  Low-voltage complexes.  No evidence of ischemia.  EKG 10/12/2019: Normal sinus rhythm with rate of 76 bpm, left atrial enlargement, normal axis.  No evidence of ischemia, normal EKG. no significant change from 09/07/2018.  Assessment & Recommendations:     ICD-10-CM   1. H/O paroxysmal supraventricular tachycardia  Z86.79   2. Agatston CAC score, >400  R93.1   3. Pure hypercholesterolemia  E78.00 CT CARDIAC SCORING ([facility] LOCATIONS ONLY)    LDL cholesterol, direct    Lipid Panel With LDL/HDL Ratio  4. Uncontrolled type 2 diabetes mellitus with hyperglycemia (HCC)  E11.65 CT CARDIAC SCORING ([facility] LOCATIONS ONLY)    Hgb A1c w/o eAG    Recommendation:  Alexander Mckay is a 70 y.o. male patient from [place]. He has uncontrolled diabetes, hyperlipidemia, h/o SVT many years ago and no recurrence of SVT over the last few years. He comes for annual visit, wants to discuss primary prevention strategies.  Is presently asymptomatic but does admit to being extremely sedentary.  I have discussed with him regarding primary  prevention.  He is concerned about one of his friends having had massive myocardial infarction and cardiac arrest in spite of "being healthy".  In view of his diabetes, I would highly recommend that patient be on an ARB.  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 He also has hyperlipidemia and LDL is markedly elevated for diabetic state.  Would prefer to have his LDL closer to 70.  I will obtain coronary calcium score further to rule stratify him.  He is presently not on aspirin as his only risk factor is diabetes mellitus and there is no known vascular disease and also has hyperlipidemia.   I obtain the labs from PCP, I do not see his lipids, A1c is markedly elevated at 11.  I have recommended that we repeat A1c along with lipids.  I also reviewed the results of the coronary calcium score which is extremely high in the >90th percentile.  I would highly  recommend that he be on a statin as well.  Consider endocrine consult.  Patient plans to leave to [place] next month, I would like to see him back after his return or he can follow-up with his PCP for close monitoring.  This was a 40-minute office visit encounter in review of external records, external labs, discussions regarding primary prevention and logic behind use of guideline directed medical therapy.   Adrian Prows, MD, [facility] 12/10/2020, 6:03 PM Office: [phone] Fax: [phone] Pager: [phone]

## 2020-12-10 ENCOUNTER — Ambulatory Visit
Admission: RE | Admit: 2020-12-10 | Discharge: 2020-12-10 | Disposition: A | Discharge status: Home / Self Care | Payer: No Typology Code available for payment source | Source: Ambulatory Visit | Attending: Cardiology | Admitting: Cardiology

## 2020-12-10 DIAGNOSIS — E7800 Pure hypercholesterolemia, unspecified: Secondary | ICD-10-CM

## 2020-12-10 DIAGNOSIS — E119 Type 2 diabetes mellitus without complications: Secondary | ICD-10-CM

## 2020-12-10 MED ORDER — ASPIRIN 81 MG PO CHEW: 81.0000 mg | CHEWABLE_TABLET | Freq: Every day | ORAL | 3 refills | Status: AC

## 2020-12-10 NOTE — Progress Notes (Signed)
Coronary calcium score 12/10/2020: 1. Total Angstrom coronary calcium score of 598, MeSA database percentile 92. Left main: 0 LAD: 375 Left circumflex: 132 RCA: 91. 2. Aortic valvular calcification without evidence of visible aneurysmal dilatation of the visualized ascending thoracic aorta. Correlation with echocardiography may be helpful. 3. Atherosclerosis of the descending thoracic aorta.

## 2020-12-12 ENCOUNTER — Telehealth: Payer: Self-pay | Admitting: Cardiology

## 2020-12-12 DIAGNOSIS — R931 Abnormal findings on diagnostic imaging of heart and coronary circulation: Secondary | ICD-10-CM

## 2020-12-12 DIAGNOSIS — E7800 Pure hypercholesterolemia, unspecified: Secondary | ICD-10-CM

## 2020-12-12 LAB — HGB A1C W/O EAG: Hgb A1c MFr Bld: 7.9 % — ABNORMAL HIGH (ref 4.8–5.6)

## 2020-12-12 LAB — LIPID PANEL WITH LDL/HDL RATIO
Cholesterol, Total: 154 mg/dL (ref 100–199)
HDL: 29 mg/dL — ABNORMAL LOW (ref >39)
LDL Chol Calc (NIH): 102 mg/dL — ABNORMAL HIGH (ref 0–99)
LDL/HDL Ratio: 3.5 ratio (ref 0.0–3.6)
Triglycerides: 129 mg/dL (ref 0–149)
VLDL Cholesterol Cal: 23 mg/dL (ref 5–40)

## 2020-12-12 LAB — LDL CHOLESTEROL, DIRECT: LDL Direct: 98 mg/dL (ref 0–99)

## 2020-12-12 MED ORDER — ATORVASTATIN CALCIUM 20 MG PO TABS: 20.0000 mg | ORAL_TABLET | Freq: Every day | ORAL | 3 refills | Status: DC

## 2020-12-12 NOTE — Telephone Encounter (Signed)
    ICD-10-CM   1. Hypercholesteremia  E78.00 atorvastatin (LIPITOR) 20 MG tablet  2. Agatston CAC score, >400  R93.1 atorvastatin (LIPITOR) 20 MG tablet    Lipid Panel Recent Labs    12/11/20 0810  CHOL 154  TRIG 129  LDLCALC 102*  HDL 29*  LDLDIRECT 98    Meds ordered this encounter  Medications  . atorvastatin (LIPITOR) 20 MG tablet    Sig: Take 1 tablet (20 mg total) by mouth daily.    Dispense:  90 tablet    Refill:  3     Yates Decamp, MD, [facility] 12/12/2020, 5:11 PM Office: [phone] Fax: [phone] Pager: [phone]

## 2020-12-17 ENCOUNTER — Ambulatory Visit: Payer: Medicare HMO | Admitting: Cardiology

## 2021-02-17 ENCOUNTER — Ambulatory Visit: Payer: Medicare HMO | Admitting: Cardiology

## 2021-02-17 ENCOUNTER — Encounter: Payer: Self-pay | Admitting: Cardiology

## 2021-02-17 ENCOUNTER — Other Ambulatory Visit: Payer: Self-pay

## 2021-02-17 VITALS — BP 126/68 | HR 89 | Temp 98.2°F | Resp 16 | Ht 71.0 in | Wt 211.6 lb

## 2021-02-17 DIAGNOSIS — I25118 Atherosclerotic heart disease of native coronary artery with other forms of angina pectoris: Secondary | ICD-10-CM

## 2021-02-17 DIAGNOSIS — R931 Abnormal findings on diagnostic imaging of heart and coronary circulation: Secondary | ICD-10-CM

## 2021-02-17 DIAGNOSIS — E7800 Pure hypercholesterolemia, unspecified: Secondary | ICD-10-CM

## 2021-02-17 DIAGNOSIS — R0789 Other chest pain: Secondary | ICD-10-CM

## 2021-02-17 DIAGNOSIS — E119 Type 2 diabetes mellitus without complications: Secondary | ICD-10-CM

## 2021-02-17 MED ORDER — NITROGLYCERIN 0.4 MG SL SUBL: 0.4000 mg | SUBLINGUAL_TABLET | SUBLINGUAL | 3 refills | Status: DC | PRN

## 2021-02-17 NOTE — Progress Notes (Signed)
Subjective:   _0  ID: Alexander Mckay, male    DOB: 05/20/1951, 70 y.o.   MRN: [id number]  Chief Complaint  Patient presents with   Chest Pain   HPI   Alexander Mckay is a 70 y.o. male patient from [place]. He has uncontrolled diabetes, hyperlipidemia, h/o SVT many years ago and no recurrence of SVT over the last few years. He comes for annual visit, wants to discuss primary prevention strategies.  Is presently asymptomatic but does admit to being extremely sedentary.  I have discussed with him regarding primary prevention.  He is concerned about one of his friends having had massive myocardial infarction and cardiac arrest in spite of "being healthy".  Patient denies any symptoms of claudication, TIA.  He denies any chest pain or shortness of breath.  No dizziness or syncope. Since last office visit and making changes to his diabetes medications, states that his blood sugar is made much improved since being on Synjardy.     Past Medical History:  Diagnosis Date   Diabetes mellitus without complication ([place])    DM 08/24/2010   Hyperlipidemia    MYALGIA 08/24/2010   Palpitations    Palpitations 09/07/2018   Pure hypercholesterolemia 08/24/2018   Uncontrolled type 2 diabetes mellitus without complication 03/24/931   Qualifier: Diagnosis of  By: Loanne Drilling MD, Hilliard Clark A    History reviewed. No pertinent surgical history.  Social History   Tobacco Use   Smoking status: Never   Smokeless tobacco: Never  Substance Use Topics   Alcohol use: Not Currently    Review of Systems  Respiratory:  Negative for shortness of breath.   Cardiovascular: Negative.  Negative for chest pain, palpitations, claudication and leg swelling.  Gastrointestinal: Negative.   Genitourinary:  Positive for dysuria.  Neurological:  Negative for focal weakness.  All other systems reviewed and are negative.  Objective:  Blood pressure 126/68, pulse 89, temperature 98.2 F (36.8 C), temperature source  Temporal, resp. rate 16, height 5' 11" (1.803 m), weight 211 lb 9.6 oz (96 kg), SpO2 95 %. Body mass index is 29.51 kg/m.   Vitals with BMI 02/17/2021 12/09/2020 11/21/2020  Height 5' 11" 5' 11" 5' 11"  Weight 211 lbs 10 oz 208 lbs 13 oz 210 lbs 10 oz  BMI 29.53 67.12 45.80  Systolic 998 338 250  Diastolic 68 65 71  Pulse 89 81 89    Physical Exam Constitutional:      General: He is not in acute distress.    Appearance: He is well-developed.  Neck:     Vascular: No carotid bruit or JVD.  Cardiovascular:     Rate and Rhythm: Normal rate and regular rhythm.     Pulses: Normal pulses and intact distal pulses.     Heart sounds: Normal heart sounds. No murmur heard.   No gallop.  Pulmonary:     Effort: Pulmonary effort is normal.     Breath sounds: Normal breath sounds.  Abdominal:     General: Bowel sounds are normal.     Palpations: Abdomen is soft.  Musculoskeletal:        General: No swelling. Normal range of motion.  Skin:    General: Skin is warm.  Neurological:     General: No focal deficit present.    Allergies  Allergen Reactions   Lisinopril Swelling   Glipizide     Lip swelling     Current Outpatient Medications on File Prior to Visit  Medication Sig  Dispense Refill   aspirin (ASPIRIN CHILDRENS) 81 MG chewable tablet Chew 1 tablet (81 mg total) by mouth daily. 90 tablet 3   atorvastatin (LIPITOR) 20 MG tablet Take 1 tablet (20 mg total) by mouth daily. 90 tablet 3   Empagliflozin-metFORMIN HCl ER (SYNJARDY XR) 12.11-998 MG TB24 Take 1 tablet by mouth 2 (two) times daily.     Semaglutide (RYBELSUS) 3 MG TABS Take 1 tablet by mouth daily.     No current facility-administered medications on file prior to visit.    Labs   Labs 10/06/2020:  Serum glucose 274, BUN 21, creatinine 1.09, EGFR 73 mL, potassium 3.9.  A1c 11.0%.  A1C 7.300 11/29/2018  Creatinine, Serum 0.970 11/29/2018  Potassium 4.200 11/29/2018 ALT (SGPT) 16.000 5/13/20201C 7.300  11/29/2018  Creatinine, Serum 0.970 11/29/2018  Potassium 4.200 11/29/2018 ALT (SGPT) 16.000 11/29/2018  09/04/2018: A1c 9.7%.  CBC normal, serum glucose 169 mg, BUN 28, creatinine 1.1, eGFR greater than 60 mL, potassium 5.6.  Total cholesterol 212, triglycerides 115, HDL 41, LDL 148.  CARDIAC STUDIES:  LE arterial duplex 04/05/11: Normal ABI, mild plaque without significant stenosis  External Review: Exercise Myoview stress test 09/13/2018:  No evidence of ischemia, normal LVEF at 72%.  Patient exercised for 7 minutes and achieved 8.4 METS.  Coronary calcium score 12/10/2020: 1. Total Angstrom coronary calcium score of 598, MeSA database percentile 92. Left main: 0 LAD: 375 Left circumflex: 132 RCA: 91. 2. Aortic valvular calcification without evidence of visible aneurysmal dilatation of the visualized ascending thoracic aorta. Correlation with echocardiography may be helpful. 3. Atherosclerosis of the descending thoracic aorta.  EKG:   EKG 02/17/2021: Normal sinus rhythm with a rate of 76 bpm, left atrial enlargement, normal axis.  Poor R wave progression, cannot exclude anteroseptal infarct old.  Low-voltage complexes.  Pulmonary disease pattern.  No evidence of ischemia. No significant change from EKG 11/21/2020  Assessment & Recommendations:     ICD-10-CM   1. Atypical chest pain  R07.89 EKG 12-Lead    PCV ECHOCARDIOGRAM COMPLETE    PCV MYOCARDIAL PERFUSION WO LEXISCAN    2. Atherosclerosis of native coronary artery of native heart with stable angina pectoris (HCC)  I25.118 PCV ECHOCARDIOGRAM COMPLETE    PCV MYOCARDIAL PERFUSION WO LEXISCAN    nitroGLYCERIN (NITROSTAT) 0.4 MG SL tablet    3. Agatston CAC score, >400  R93.1 PCV MYOCARDIAL PERFUSION WO LEXISCAN    4. Controlled type 2 diabetes mellitus without complication, without long-term current use of insulin (HCC)  E11.9 PCV MYOCARDIAL PERFUSION WO LEXISCAN    5. Pure hypercholesterolemia  E78.00 Lipid Panel With  LDL/HDL Ratio      Recommendation:  Alexander Mckay is a 70 y.o. male patient from [place]. He has uncontrolled diabetes, hyperlipidemia, h/o SVT many years ago and no recurrence of SVT over the last few years, markedly elevated coronary calcium score >400 on 12/10/2020, was started on lipid-lowering therapy 2 months ago.  He now presents for evaluation of chest pain described as tightness in the left upper part of the chest lasting a few minutes with radiation to the shoulder and neck area.  Symptoms, with or without exertion activity, no rest pain.  He admits to not being active.  Symptoms of chest pain could be angina pectoris, in view of uncontrolled diabetes mellitus, hyperlipidemia chest not on therapy until recently, markedly elevated coronary calcium score all point to high risk features. Schedule for a Exercise Nuclear stress test to evaluate for myocardial ischemia. Will schedule for   an echocardiogram. S/L NTG was prescribed and explained how to and when to use it and to notify us if there is change in frequency of use. Interaction with cialis-like agents (if applicable was discussed).   He is planning on a trip to go to [place] soon.  I would like to obtain these tests prior to his visit and if abnormal will reach out to him to postpone his visit however if low risk he can travel and I will see him back after his return in 6 to 7 months.  We will obtain lipid profile testing.    Jay Ganji, MD, FACC 02/17/2021, 5:32 PM Office: [phone] Fax: [phone] Pager: [phone]  

## 2021-03-04 ENCOUNTER — Telehealth: Payer: Self-pay

## 2021-03-04 DIAGNOSIS — R931 Abnormal findings on diagnostic imaging of heart and coronary circulation: Secondary | ICD-10-CM

## 2021-03-04 DIAGNOSIS — E7800 Pure hypercholesterolemia, unspecified: Secondary | ICD-10-CM

## 2021-03-04 LAB — LIPID PANEL WITH LDL/HDL RATIO
Cholesterol, Total: 126 mg/dL (ref 100–199)
HDL: 31 mg/dL — ABNORMAL LOW (ref >39)
LDL Chol Calc (NIH): 73 mg/dL (ref 0–99)
LDL/HDL Ratio: 2.4 ratio (ref 0.0–3.6)
Triglycerides: 122 mg/dL (ref 0–149)
VLDL Cholesterol Cal: 22 mg/dL (ref 5–40)

## 2021-03-04 MED ORDER — ATORVASTATIN CALCIUM 20 MG PO TABS
20.0000 mg | ORAL_TABLET | Freq: Every day | ORAL | 3 refills | Status: DC
Start: 2021-03-04 — End: 2021-11-12

## 2021-03-04 NOTE — Progress Notes (Signed)
Called and spoke with patient regarding his lipids and he is aware NOT to STOP the atorvastatin.

## 2021-03-04 NOTE — Telephone Encounter (Signed)
refill 

## 2021-03-04 NOTE — Progress Notes (Signed)
Let him know the lipids has improved significantly and not to stop Atorvastatin.

## 2021-03-09 ENCOUNTER — Ambulatory Visit: Payer: Medicare HMO

## 2021-03-09 ENCOUNTER — Other Ambulatory Visit: Payer: Self-pay

## 2021-03-09 DIAGNOSIS — I25118 Atherosclerotic heart disease of native coronary artery with other forms of angina pectoris: Secondary | ICD-10-CM

## 2021-03-09 DIAGNOSIS — R0789 Other chest pain: Secondary | ICD-10-CM

## 2021-03-09 DIAGNOSIS — R931 Abnormal findings on diagnostic imaging of heart and coronary circulation: Secondary | ICD-10-CM

## 2021-03-09 DIAGNOSIS — E119 Type 2 diabetes mellitus without complications: Secondary | ICD-10-CM

## 2021-09-07 ENCOUNTER — Ambulatory Visit: Payer: Medicare HMO | Admitting: Cardiology

## 2021-09-23 ENCOUNTER — Other Ambulatory Visit: Payer: Self-pay

## 2021-09-23 ENCOUNTER — Ambulatory Visit: Payer: Medicare HMO | Admitting: Cardiology

## 2021-09-23 ENCOUNTER — Encounter: Payer: Self-pay | Admitting: Cardiology

## 2021-09-23 VITALS — BP 129/73 | HR 72 | Temp 97.9°F | Resp 17 | Ht 71.0 in | Wt 207.2 lb

## 2021-09-23 DIAGNOSIS — I25118 Atherosclerotic heart disease of native coronary artery with other forms of angina pectoris: Secondary | ICD-10-CM

## 2021-09-23 DIAGNOSIS — Z8679 Personal history of other diseases of the circulatory system: Secondary | ICD-10-CM

## 2021-09-23 DIAGNOSIS — E1165 Type 2 diabetes mellitus with hyperglycemia: Secondary | ICD-10-CM

## 2021-09-23 DIAGNOSIS — E7800 Pure hypercholesterolemia, unspecified: Secondary | ICD-10-CM

## 2021-09-23 DIAGNOSIS — R931 Abnormal findings on diagnostic imaging of heart and coronary circulation: Secondary | ICD-10-CM

## 2021-09-23 NOTE — Progress Notes (Signed)
? ? ?Subjective:  ? ?@Patient ID: Alexander Mckay, male    DOB: 12/25/1950, 70 y.o.   MRN: [id number] ? ?Chief Complaint  ?Patient presents with  ? hld  ? Follow-up  ?  1 year  ? ?HPI   Alexander Mckay is a 70 y.o.  [place] male patient with uncontrolled diabetes mellitus, hyperlipidemia, history of remote SVT with no recurrence, markedly elevated coronary calcium score in >90th percentile in 2022 presents here for annual visit. ? ?He has just returned back from [place], his weight has remained stable.  Essentially asymptomatic.  States that his blood sugar continues to remain uncontrolled.  ? ?Past Medical History:  ?Diagnosis Date  ? Diabetes mellitus without complication (HCC)   ? DM 08/24/2010  ? Hyperlipidemia   ? MYALGIA 08/24/2010  ? Palpitations   ? Palpitations 09/07/2018  ? Pure hypercholesterolemia 08/24/2018  ? Uncontrolled type 2 diabetes mellitus without complication 08/24/2010  ? Qualifier: Diagnosis of  By: Ellison MD, Sean A   ? ?History reviewed. No pertinent surgical history. ? ?Social History  ? ?Tobacco Use  ? Smoking status: Never  ? Smokeless tobacco: Never  ?Substance Use Topics  ? Alcohol use: Not Currently  ? ? ?Review of Systems  ?Respiratory:  Negative for shortness of breath.   ?Cardiovascular:  Negative for chest pain, palpitations and leg swelling.   ?Objective:  ?Blood pressure 129/73, pulse 72, temperature 97.9 ?F (36.6 ?C), temperature source Temporal, resp. rate 17, height 5' 11" (1.803 m), weight 207 lb 3.2 oz (94 kg), SpO2 97 %. Body mass index is 28.9 kg/m?.  ? ?Vitals with BMI 09/23/2021 02/17/2021 12/09/2020  ?Height 5' 11" 5' 11" 5' 11"  ?Weight 207 lbs 3 oz 211 lbs 10 oz 208 lbs 13 oz  ?BMI 28.91 29.53 29.13  ?Systolic 129 126 100  ?Diastolic 73 68 65  ?Pulse 72 89 81  ?  ?Physical Exam ?Constitutional:   ?   Appearance: He is well-developed.  ?Neck:  ?   Vascular: No JVD.  ?Cardiovascular:  ?   Rate and Rhythm: Normal rate and regular rhythm.  ?   Pulses: Normal pulses and intact  distal pulses.  ?   Heart sounds: Normal heart sounds. No murmur heard. ?  No gallop.  ?Pulmonary:  ?   Effort: Pulmonary effort is normal.  ?   Breath sounds: Normal breath sounds.  ?Abdominal:  ?   General: Bowel sounds are normal.  ?   Palpations: Abdomen is soft.  ?Musculoskeletal:  ?   Right lower leg: No edema.  ?   Left lower leg: No edema.  ? ? ?Allergies  ?Allergen Reactions  ? Lisinopril Swelling  ? Glipizide   ?  Lip swelling   ?  ? ?Current Outpatient Medications:  ?  aspirin (ASPIRIN CHILDRENS) 81 MG chewable tablet, Chew 1 tablet (81 mg total) by mouth daily., Disp: 90 tablet, Rfl: 3 ?  atorvastatin (LIPITOR) 20 MG tablet, Take 1 tablet (20 mg total) by mouth daily., Disp: 90 tablet, Rfl: 3 ?  Empagliflozin-metFORMIN HCl ER (SYNJARDY XR) 12.11-998 MG TB24, Take 1 tablet by mouth 2 (two) times daily., Disp: , Rfl:  ?  nitroGLYCERIN (NITROSTAT) 0.4 MG SL tablet, Place 1 tablet (0.4 mg total) under the tongue every 5 (five) minutes as needed for up to 25 days for chest pain., Disp: 25 tablet, Rfl: 3 ?  Semaglutide (RYBELSUS) 3 MG TABS, Take 1 tablet by mouth daily., Disp: , Rfl:  Labs  ?  ?  Lipid Panel  ?   ?Component Value Date/Time  ? CHOL 126 03/03/2021 0827  ? TRIG 122 03/03/2021 0827  ? HDL 31 (L) 03/03/2021 0827  ? [place] 73 03/03/2021 0827  ? LDLDIRECT 98 12/11/2020 0810  ? ?HEMOGLOBIN A1C ?Lab Results  ?Component Value Date  ? HGBA1C 7.9 (H) 12/11/2020  ?  ?Labs 10/06/2020: ? ?Serum glucose 274, BUN 21, creatinine 1.09, EGFR 73 mL, potassium 3.9. ? ?A1c 11.0%. ? ?CARDIAC STUDIES: ? ?LE arterial duplex 04/05/11: Normal ABI, mild plaque without significant stenosis ? ?External Review: Exercise Myoview stress test 09/13/2018:  ?No evidence of ischemia, normal LVEF at 72%.  Patient exercised for 7 minutes and achieved 8.4 METS. ? ?Coronary calcium score 12/10/2020: ?1. Total Angstrom coronary calcium score of 598, MeSA database percentile 92. ?Left main: 0 ?LAD: 375 ?Left circumflex: 132 ?RCA: 91. ?2.  Aortic valvular calcification without evidence of visible aneurysmal dilatation of the visualized ascending thoracic aorta. Correlation with echocardiography may be helpful. ?3. Atherosclerosis of the descending thoracic aorta. ? ?EKG: ? ?EKG 09/23/2021: Normal sinus rhythm at rate of 65 bpm, normal axis.  Single PVC.  Otherwise normal EKG.  No significant change from 02/17/2021. ? ?Assessment & Recommendations:  ? ?  ICD-10-CM   ?1. Atherosclerosis of native coronary artery of native heart with stable angina pectoris ([place])  I25.118   ?  ?2. Elevated coronary artery calcium score 92nd MESA database percentile 12/10/20  R93.1   ?  ?3. Type 2 diabetes mellitus with hyperglycemia, without long-term current use of insulin (HCC)  E11.65   ?  ?4. Pure hypercholesterolemia  E78.00   ?  ?5. H/O paroxysmal supraventricular tachycardia  Z86.79 EKG 12-Lead  ?  ? ?Recommendation: ? ?Alexander Mckay is a 71 y.o. [place] male patient with uncontrolled diabetes mellitus, hyperlipidemia, history of remote SVT with no recurrence, markedly elevated coronary calcium score in >90th percentile in 2022 presents here for annual visit. ? ?He has just returned back from [place], his weight has remained stable.  Blood pressure is normal.  I reviewed his labs, lipids under excellent control I do not have his recent labs that was done 2 weeks ago but patient's diabetes continues to be uncontrolled.  Could consider increasing Rybelsus to 7 mg daily both for weight loss and also diabetes control. ? ?I would also consider addition of losartan 25 or 50 mg daily in view of underlying coronary artery disease and also diabetes mellitus. ? ?Otherwise he is on appropriate medical therapy, no change in physical exam, weight loss and primary prevention discussed with the patient. ? ? ? ?Adrian Prows, MD, [facility] ?09/23/2021, 11:28 AM ?Office: [phone] ?Fax: [phone] ?Pager: [phone]  ?

## 2021-09-25 ENCOUNTER — Other Ambulatory Visit: Payer: Self-pay

## 2021-09-25 ENCOUNTER — Encounter: Payer: Self-pay | Admitting: Podiatry

## 2021-09-25 ENCOUNTER — Ambulatory Visit (INDEPENDENT_AMBULATORY_CARE_PROVIDER_SITE_OTHER): Payer: Medicare HMO | Admitting: Podiatry

## 2021-09-25 VITALS — BP 153/83 | HR 75 | Temp 97.2°F

## 2021-09-25 DIAGNOSIS — M21612 Bunion of left foot: Secondary | ICD-10-CM | POA: Diagnosis not present

## 2021-09-25 DIAGNOSIS — M2042 Other hammer toe(s) (acquired), left foot: Secondary | ICD-10-CM

## 2021-09-25 DIAGNOSIS — B351 Tinea unguium: Secondary | ICD-10-CM | POA: Diagnosis not present

## 2021-09-25 DIAGNOSIS — E1165 Type 2 diabetes mellitus with hyperglycemia: Secondary | ICD-10-CM | POA: Diagnosis not present

## 2021-09-25 DIAGNOSIS — K219 Gastro-esophageal reflux disease without esophagitis: Secondary | ICD-10-CM | POA: Insufficient documentation

## 2021-09-25 DIAGNOSIS — E119 Type 2 diabetes mellitus without complications: Secondary | ICD-10-CM

## 2021-09-25 DIAGNOSIS — B353 Tinea pedis: Secondary | ICD-10-CM | POA: Diagnosis not present

## 2021-09-25 DIAGNOSIS — Z86010 Personal history of colonic polyps: Secondary | ICD-10-CM | POA: Insufficient documentation

## 2021-09-25 DIAGNOSIS — R1013 Epigastric pain: Secondary | ICD-10-CM | POA: Insufficient documentation

## 2021-09-25 NOTE — Patient Instructions (Signed)

## 2021-09-26 MED ORDER — CLOTRIMAZOLE 1 % EX CREA: TOPICAL_CREAM | CUTANEOUS | 1 refills | Status: DC

## 2021-09-26 NOTE — Progress Notes (Signed)
Subjective: ?Alexander Mckay presents today referred by Sherrie Mustache, MD for diabetic foot evaluation. ? ?Today, patient c/o  balance issues when walking,and would like diabetic shoes/inserts to assist with this. He relates some tightness in forefoot area at night. He does not relate any prior episodes of trauma . ? ?Patient relates 15-20 year history of diabetes. ? ?Patient denies any symptoms of numbness, tingling, burning or pins/needle sensation in feet. ? ?Patient relates blood glucose was 158 mg/dl this morning. Last A1c was 8.0%. ? ?Risk factors: uncontrolled diabetes, hyperlipidemia ? ?PCP is Sherrie Mustache, MD , and last visit was last week. ? ?Past Medical History:  ?Diagnosis Date  ? Diabetes mellitus without complication (HCC)   ? DM 08/24/2010  ? Hyperlipidemia   ? MYALGIA 08/24/2010  ? Palpitations   ? Palpitations 09/07/2018  ? Pure hypercholesterolemia 08/24/2018  ? Uncontrolled type 2 diabetes mellitus without complication 08/24/2010  ? Qualifier: Diagnosis of  By: Everardo All MD, Gregary Signs A   ? ? ?Patient Active Problem List  ? Diagnosis Date Noted  ? Epigastric pain 09/25/2021  ? Gastroesophageal reflux disease 09/25/2021  ? History of colonic polyps 09/25/2021  ? Palpitations 09/07/2018  ? Laboratory examination 09/07/2018  ? Pure hypercholesterolemia 08/24/2018  ? Uncontrolled diabetes mellitus with hyperglycemia (HCC) 08/24/2010  ? MYALGIA 08/24/2010  ? ? ?History reviewed. No pertinent surgical history. ? ?Current Outpatient Medications on File Prior to Visit  ?Medication Sig Dispense Refill  ? aspirin (ASPIRIN CHILDRENS) 81 MG chewable tablet Chew 1 tablet (81 mg total) by mouth daily. 90 tablet 3  ? atorvastatin (LIPITOR) 20 MG tablet Take 1 tablet (20 mg total) by mouth daily. 90 tablet 3  ? Empagliflozin-metFORMIN HCl ER (SYNJARDY XR) 12.11-998 MG TB24 Take 1 tablet by mouth 2 (two) times daily.    ? nitroGLYCERIN (NITROSTAT) 0.4 MG SL tablet Place 1 tablet (0.4 mg total) under the tongue every 5  (five) minutes as needed for up to 25 days for chest pain. 25 tablet 3  ? Semaglutide (RYBELSUS) 3 MG TABS Take 1 tablet by mouth daily.    ? ?No current facility-administered medications on file prior to visit.  ?  ? ?Allergies  ?Allergen Reactions  ? Lisinopril Swelling  ? Glipizide   ?  Lip swelling   ? ? ?Social History  ? ?Occupational History  ?  Comment: Retired  ?Tobacco Use  ? Smoking status: Never  ? Smokeless tobacco: Never  ?Vaping Use  ? Vaping Use: Never used  ?Substance and Sexual Activity  ? Alcohol use: Not Currently  ? Drug use: Never  ? Sexual activity: Not on file  ? ? ?Family History  ?Problem Relation Age of Onset  ? Cancer Mother   ?     Ovarian Cancer  ? Heart attack Father   ? Diabetes Sister   ? Diabetes Sister   ? Diabetes Sister   ? Diabetes Brother   ? ? ?Immunization History  ?Administered Date(s) Administered  ? Influenza Whole 03/19/2010  ? Td 07/19/2009  ? ? ?Objective: ?Vitals:  ? 09/25/21 1212  ?BP: (!) 153/83  ?Pulse: 75  ?Temp: (!) 97.2 ?F (36.2 ?C)  ? ? ?Alexander Mckay is a pleasant 71 y.o. male obese in NAD. AAO X 3. ? ?Vascular Examination: ?CFT <3 seconds b/l LE. Palpable DP pulse(s) b/l LE. Palpable PT pulse(s) b/l LE. Pedal hair sparse. No pain with calf compression b/l. Lower extremity skin temperature gradient within normal limits. Trace edema noted BLE. No ischemia or  gangrene noted b/l LE. No cyanosis or clubbing noted b/l LE. ? ?Dermatological Examination: ?Pedal skin is warm and supple b/l LE. No open wounds b/l LE. No interdigital macerations noted b/l LE. Toenails left great toe, L 2nd toe, R 2nd toe, and R 5th toe elongated, discolored, dystrophic, thickened, and crumbly with subungual debris and tenderness to dorsal palpation. Mild skin eruption noted peripheral aspect of both feet consistent with mild tinea pedis. No hyperkeratotic nor porokeratotic lesions present on today's visit. ? ?Musculoskeletal Examination: ?Muscle strength 5/5 to all lower extremity  muscle groups bilaterally. Mild early bunion deformity left foot. Hammertoe left 2nd digit. Patient ambulates independent of any assistive aids. ? ?Footwear Assessment: ?Does the patient wear appropriate shoes? Yes. ?Does the patient need inserts/orthotics? Yes. ? ?Neurological Examination: ?Protective sensation intact 5/5 intact bilaterally with 10g monofilament b/l. Vibratory sensation intact b/l. Proprioception intact bilaterally. Clonus negative b/l. ? ?A1c:   ?Hemoglobin A1C Latest Ref Rng & Units 12/11/2020  ?HGBA1C 4.8 - 5.6 % 7.9(H)  ?Some recent data might be hidden  ? ?Assessment: ?1. Onychomycosis   ?2. Tinea pedis of both feet   ?3. Bunion, left foot   ?4. Hammertoe of second toe of left foot   ?5. Uncontrolled type 2 diabetes mellitus with hyperglycemia (HCC)   ?6. Encounter for diabetic foot exam (HCC)   ?  ?ADA Risk Categorization: ?Low Risk:  ?Patient has all of the following: ?Intact protective sensation ?No prior foot ulcer  ?No severe deformity ?Pedal pulses present ? ?Plan: ?-Examined patient. ?-Diabetic foot examination performed today. ?-Discussed diabetic foot care principles. Literature dispensed on today. ?-Patient to continue soft, supportive shoe gear daily. Start procedure for diabetic shoes. Patient qualifies based on diagnoses. ?-Toenails bilateral 2nd toes, L hallux, and R 5th toe debrided in length and girth without iatrogenic bleeding with sterile nail nipper and dremel.  ?-For tinea pedis, Rx sent to pharmacy for Clotrimazole Cream 1% to be applied twice daily for six weeks. ?-Patient/POA to call should there be question/concern in the interim. ? ?Return in about 3 months (around 12/26/2021). ? ?Freddie Breech, DPM ?

## 2021-10-28 ENCOUNTER — Ambulatory Visit: Payer: Medicare HMO | Admitting: Podiatry

## 2021-11-12 ENCOUNTER — Encounter: Payer: Self-pay | Admitting: Cardiology

## 2021-11-12 ENCOUNTER — Ambulatory Visit: Payer: Medicare HMO | Admitting: Cardiology

## 2021-11-12 VITALS — BP 130/83 | HR 69 | Temp 97.7°F | Resp 16 | Ht 71.0 in | Wt 208.0 lb

## 2021-11-12 DIAGNOSIS — I25118 Atherosclerotic heart disease of native coronary artery with other forms of angina pectoris: Secondary | ICD-10-CM

## 2021-11-12 MED ORDER — NITROGLYCERIN 0.4 MG SL SUBL: 0.4000 mg | SUBLINGUAL_TABLET | SUBLINGUAL | 3 refills | Status: AC | PRN

## 2021-11-12 NOTE — Progress Notes (Signed)
? ? ?Subjective:  ? ?_0  ID: Alexander Mckay, male    DOB: 1950/12/28, 71 y.o.   MRN: [id number] ? ?Chief Complaint  ?Patient presents with  ? Chest Pain  ? ?HPI   Alexander Mckay is a 71 y.o.  [place] male patient with uncontrolled diabetes mellitus, hyperlipidemia, history of remote SVT with no recurrence, markedly elevated coronary calcium score in >90th percentile in 2022 presents here for acute visit for chest pain. ? ?Patient had some argument last week, had chest tightness that lasted about 5 minutes or so.  He has not had any recurrence.  ? ?Past Medical History:  ?Diagnosis Date  ? Diabetes mellitus without complication ([place])   ? DM 08/24/2010  ? Hyperlipidemia   ? MYALGIA 08/24/2010  ? Palpitations   ? Palpitations 09/07/2018  ? Pure hypercholesterolemia 08/24/2018  ? Uncontrolled type 2 diabetes mellitus without complication 07/20/9415  ? Qualifier: Diagnosis of  By: Loanne Drilling MD, Jacelyn Pi   ? ?History reviewed. No pertinent surgical history. ? ?Social History  ? ?Tobacco Use  ? Smoking status: Never  ? Smokeless tobacco: Never  ?Substance Use Topics  ? Alcohol use: Not Currently  ? ? ?Review of Systems  ?Respiratory:  Negative for shortness of breath.   ?Cardiovascular:  Negative for chest pain, palpitations and leg swelling.   ?Objective:  ?Blood pressure 130/83, pulse 69, temperature 97.7 ?F (36.5 ?C), resp. rate 16, height _1  (1.803 m), weight 208 lb (94.3 kg), SpO2 95 %. Body mass index is 29.01 kg/m?.  ? ? ?  11/12/2021  ?  3:00 PM 09/25/2021  ? 12:12 PM 09/23/2021  ? 10:39 AM  ?Vitals with BMI  ?Height _2   _3   ?Weight 208 lbs  207 lbs 3 oz  ?BMI 29.02  28.91  ?Systolic 408 144 818  ?Diastolic 83 83 73  ?Pulse 69 75 72  ?  ?Physical Exam ?Constitutional:   ?   Appearance: He is well-developed.  ?Neck:  ?   Vascular: No JVD.  ?Cardiovascular:  ?   Rate and Rhythm: Normal rate and regular rhythm.  ?   Pulses: Normal pulses and intact distal pulses.  ?   Heart sounds: Normal heart sounds. No  murmur heard. ?  No gallop.  ?Pulmonary:  ?   Effort: Pulmonary effort is normal.  ?   Breath sounds: Normal breath sounds.  ?Abdominal:  ?   General: Bowel sounds are normal.  ?   Palpations: Abdomen is soft.  ?Musculoskeletal:  ?   Right lower leg: No edema.  ?   Left lower leg: No edema.  ? ? ?Allergies  ?Allergen Reactions  ? Lisinopril Swelling  ? Glipizide   ?  Lip swelling   ?  ? ?Current Outpatient Medications:  ?  aspirin (ASPIRIN CHILDRENS) 81 MG chewable tablet, Chew 1 tablet (81 mg total) by mouth daily., Disp: 90 tablet, Rfl: 3 ?  atorvastatin (LIPITOR) 10 MG tablet, Take 10 mg by mouth daily., Disp: , Rfl:  ?  Cholecalciferol (VITAMIN D3) 1.25 MG (50000 UT) CAPS, Take 1 capsule by mouth once a week., Disp: , Rfl:  ?  clotrimazole (LOTRIMIN) 1 % cream, Apply to both feet and between toes twice daily, Disp: 60 g, Rfl: 1 ?  Empagliflozin-metFORMIN HCl ER (SYNJARDY XR) 12.11-998 MG TB24, Take 1 tablet by mouth 2 (two) times daily., Disp: , Rfl:  ?  JARDIANCE 25 MG TABS tablet, Take 25 mg by mouth daily., Disp: , Rfl:  ?  metFORMIN (GLUCOPHAGE) 1000 MG tablet, Take 1,000 mg by mouth 2 (two) times daily., Disp: , Rfl:  ?  nitroGLYCERIN (NITROSTAT) 0.4 MG SL tablet, Place 1 tablet (0.4 mg total) under the tongue every 5 (five) minutes as needed for up to 25 days for chest pain., Disp: 25 tablet, Rfl: 3 ?  Semaglutide (RYBELSUS) 3 MG TABS, Take 1 tablet by mouth daily., Disp: , Rfl:  Labs  ?  ?Lipid Panel  ?   ?Component Value Date/Time  ? CHOL 126 03/03/2021 0827  ? TRIG 122 03/03/2021 0827  ? HDL 31 (L) 03/03/2021 0827  ? [place] 73 03/03/2021 0827  ? LDLDIRECT 98 12/11/2020 0810  ? ?HEMOGLOBIN A1C ?Lab Results  ?Component Value Date  ? HGBA1C 7.9 (H) 12/11/2020  ?  ?Labs 10/06/2020: ? ?Serum glucose 274, BUN 21, creatinine 1.09, EGFR 73 mL, potassium 3.9. ? ?A1c 11.0%. ? ?CARDIAC STUDIES: ? ?LE arterial duplex 04/05/11: Normal ABI, mild plaque without significant stenosis ? ?External Review: Exercise Myoview  stress test 09/13/2018:  ?No evidence of ischemia, normal LVEF at 72%.  Patient exercised for 7 minutes and achieved 8.4 METS. ? ?Coronary calcium score 12/10/2020: ?1. Total Angstrom coronary calcium score of 598, MeSA database percentile 92. ?Left main: 0 ?LAD: 375 ?Left circumflex: 132 ?RCA: 91. ?2. Aortic valvular calcification without evidence of visible aneurysmal dilatation of the visualized ascending thoracic aorta. Correlation with echocardiography may be helpful. ?3. Atherosclerosis of the descending thoracic aorta. ? ?EKG: ? ?EKG 11/12/2021: Normal sinus rhythm at rate of 75 bpm, normal axis, incomplete right bundle branch block.  Poor R wave progression, cannot exclude anteroseptal infarct old.  Low-voltage complexes.  Pulmonary disease pattern.  PVCs (2).  No significant change from 09/23/2021,   ? ?Assessment & Recommendations:  ? ?  ICD-10-CM   ?1. Atherosclerosis of native coronary artery of native heart with stable angina pectoris (HCC)  I25.118 EKG 12-Lead  ?  nitroGLYCERIN (NITROSTAT) 0.4 MG SL tablet  ?  ? ?Recommendation: ? ?Alexander Mckay is a 71 y.o. [place] male patient with uncontrolled diabetes mellitus, hyperlipidemia, history of remote SVT with no recurrence, markedly elevated coronary calcium score in >90th percentile in 2022 presents here for acute visit for chest pain. ? ?Patient had some argument last week, had chest tightness that lasted about 5 minutes or so.  He has not had any recurrence.  No significant change in his EKG.  I discussed with him regarding chronic stable angina.  Sublingual nitroglycerin was represcribed and advised him that if he has episode of angina that lasts more than 5 to 10 minutes to take extra dose of aspirin and also nitroglycerin. ? ?S/L NTG was prescribed and explained how to and when to use it and to notify us if there is change in frequency of use. Interaction with cialis-like agents (if applicable was discussed).   ? ?Otherwise he is on appropriate  medical therapy, no change in physical exam, weight loss and primary prevention discussed with the patient. ? ? ? ?Adrian Prows, MD, [facility] ?11/12/2021, 3:49 PM ?Office: [phone] ?Fax: [phone] ?Pager: [phone]  ?

## 2021-11-13 ENCOUNTER — Ambulatory Visit: Payer: Medicare HMO | Admitting: Cardiology

## 2021-11-24 ENCOUNTER — Other Ambulatory Visit (INDEPENDENT_AMBULATORY_CARE_PROVIDER_SITE_OTHER): Payer: Medicare HMO | Admitting: Podiatry

## 2021-11-24 DIAGNOSIS — E1165 Type 2 diabetes mellitus with hyperglycemia: Secondary | ICD-10-CM

## 2021-11-24 DIAGNOSIS — M2042 Other hammer toe(s) (acquired), left foot: Secondary | ICD-10-CM

## 2021-11-24 DIAGNOSIS — M21612 Bunion of left foot: Secondary | ICD-10-CM

## 2021-11-24 NOTE — Progress Notes (Signed)
Order entered for diabetic shoes. 

## 2021-12-09 ENCOUNTER — Ambulatory Visit: Payer: Medicare HMO | Admitting: Cardiology

## 2021-12-17 ENCOUNTER — Ambulatory Visit: Payer: Medicare HMO | Admitting: Cardiology

## 2022-01-01 ENCOUNTER — Ambulatory Visit (INDEPENDENT_AMBULATORY_CARE_PROVIDER_SITE_OTHER): Payer: Self-pay | Admitting: Podiatry

## 2022-01-01 DIAGNOSIS — Z91199 Patient's noncompliance with other medical treatment and regimen due to unspecified reason: Secondary | ICD-10-CM

## 2022-01-07 NOTE — Progress Notes (Signed)
   Complete physical exam  Patient: Alexander Mckay   DOB: 05/08/1999   71 y.o. Male  MRN: [id number]  Subjective:    No chief complaint on file.   Alexander Mckay is a 71 y.o. male who presents today for a complete physical exam. She reports consuming a {diet types:17450} diet. {types:19826} She generally feels {DESC; WELL/FAIRLY WELL/POORLY:18703}. She reports sleeping {DESC; WELL/FAIRLY WELL/POORLY:18703}. She {does/does not:200015} have additional problems to discuss today.    Most recent fall risk assessment:    01/13/2022   10:42 AM  Fall Risk   Falls in the past year? 0  Number falls in past yr: 0  Injury with Fall? 0  Risk for fall due to : No Fall Risks  Follow up Falls evaluation completed     Most recent depression screenings:    01/13/2022   10:42 AM 12/04/2020   10:46 AM  PHQ 2/9 Scores  PHQ - 2 Score 0 0  PHQ- 9 Score 5     {VISON DENTAL STD PSA (Optional):27386}  {History (Optional):23778}  Patient Care Team: Jessup, Joy, NP as PCP - General (Nurse Practitioner)   Outpatient Medications Prior to Visit  Medication Sig   fluticasone (FLONASE) 50 MCG/ACT nasal spray Place 2 sprays into both nostrils in the morning and at bedtime. After 7 days, reduce to once daily.   norgestimate-ethinyl estradiol (SPRINTEC 28) 0.25-35 MG-MCG tablet Take 1 tablet by mouth daily.   Nystatin POWD Apply liberally to affected area 2 times per day   spironolactone (ALDACTONE) 100 MG tablet Take 1 tablet (100 mg total) by mouth daily.   No facility-administered medications prior to visit.    ROS        Objective:     There were no vitals taken for this visit. {Vitals History (Optional):23777}  Physical Exam   No results found for any visits on 02/18/22. {Show previous labs (optional):23779}    Assessment & Plan:    Routine Health Maintenance and Physical Exam  Immunization History  Administered Date(s) Administered   DTaP 07/22/1999, 09/17/1999,  11/26/1999, 08/11/2000, 02/25/2004   Hepatitis A 12/22/2007, 12/27/2008   Hepatitis B 05/09/1999, 06/16/1999, 11/26/1999   HiB (PRP-OMP) 07/22/1999, 09/17/1999, 11/26/1999, 08/11/2000   IPV 07/22/1999, 09/17/1999, 05/16/2000, 02/25/2004   Influenza,inj,Quad PF,6+ Mos 03/29/2014   Influenza-Unspecified 06/28/2012   MMR 05/16/2001, 02/25/2004   Meningococcal Polysaccharide 12/27/2011   Pneumococcal Conjugate-13 08/11/2000   Pneumococcal-Unspecified 11/26/1999, 02/09/2000   Tdap 12/27/2011   Varicella 05/16/2000, 12/22/2007    Health Maintenance  Topic Date Due   HIV Screening  Never done   Hepatitis C Screening  Never done   INFLUENZA VACCINE  02/16/2022   PAP-Cervical Cytology Screening  02/18/2022 (Originally 05/07/2020)   PAP SMEAR-Modifier  02/18/2022 (Originally 05/07/2020)   TETANUS/TDAP  02/18/2022 (Originally 12/26/2021)   HPV VACCINES  Discontinued   COVID-19 Vaccine  Discontinued    Discussed health benefits of physical activity, and encouraged her to engage in regular exercise appropriate for her age and condition.  Problem List Items Addressed This Visit   None Visit Diagnoses     Annual physical exam    -  Primary   Cervical cancer screening       Need for Tdap vaccination          No follow-ups on file.     Joy Jessup, NP   

## 2022-07-05 ENCOUNTER — Ambulatory Visit: Payer: Medicare HMO | Admitting: Cardiology

## 2022-07-05 ENCOUNTER — Encounter: Payer: Self-pay | Admitting: Cardiology

## 2022-07-05 VITALS — BP 123/66 | HR 76 | Resp 16 | Ht 71.0 in | Wt 209.0 lb

## 2022-07-05 DIAGNOSIS — E7800 Pure hypercholesterolemia, unspecified: Secondary | ICD-10-CM

## 2022-07-05 DIAGNOSIS — E1165 Type 2 diabetes mellitus with hyperglycemia: Secondary | ICD-10-CM

## 2022-07-05 DIAGNOSIS — I25118 Atherosclerotic heart disease of native coronary artery with other forms of angina pectoris: Secondary | ICD-10-CM

## 2022-07-05 DIAGNOSIS — Z8679 Personal history of other diseases of the circulatory system: Secondary | ICD-10-CM

## 2022-07-05 MED ORDER — ATORVASTATIN CALCIUM 10 MG PO TABS
10.0000 mg | ORAL_TABLET | Freq: Every day | ORAL | 3 refills | Status: AC
Start: 2022-07-05 — End: ?

## 2022-07-05 NOTE — Progress Notes (Signed)
Subjective:   _0  ID: Alexander Mckay, male    DOB: [birth year], 71 y.o.   MRN: [id number]  Chief Complaint  Patient presents with   Shortness of Breath   Follow-up   Coronary Artery Disease   HPI   Alexander Mckay is a 71 y.o. [place] male patient with uncontrolled diabetes mellitus, hyperlipidemia, history of remote SVT with no recurrence, markedly elevated coronary calcium score in >90th percentile in 2022, last seen normal for 6 months ago for chest pain.  He is now back from [place] and wanted to follow-up with me for routine care.  He just returned from several month trip to [place].  States that he is noticing gradual decrease in exercise tolerance but on further questioning, states that he has been very sedentary.  Admits that his diabetes is not well-controlled.  Past Medical History:  Diagnosis Date   Diabetes mellitus without complication ([place])    DM 08/24/2010   Hyperlipidemia    MYALGIA 08/24/2010   Palpitations    Palpitations 09/07/2018   Pure hypercholesterolemia 08/24/2018   Uncontrolled type 2 diabetes mellitus without complication 09/18/2949   Qualifier: Diagnosis of  By: Loanne Drilling MD, Hilliard Clark A    History reviewed. No pertinent surgical history.  Social History   Tobacco Use   Smoking status: Never   Smokeless tobacco: Never  Substance Use Topics   Alcohol use: Not Currently    Review of Systems  Respiratory:  Negative for shortness of breath.   Cardiovascular:  Negative for chest pain, palpitations and leg swelling.    Objective:  Blood pressure 123/66, pulse 76, resp. rate 16, height _1  (1.803 m), weight 209 lb (94.8 kg), SpO2 94 %. Body mass index is 29.15 kg/m.      07/05/2022    9:32 AM 11/12/2021    3:00 PM 09/25/2021   12:12 PM  Vitals with BMI  Height _2  _3    Weight 209 lbs 208 lbs   BMI 88.41 66.06   Systolic 301 601 093  Diastolic 66 83 83  Pulse 76 69 75    Physical Exam Constitutional:      Appearance: He is  well-developed.  Neck:     Vascular: No JVD.  Cardiovascular:     Rate and Rhythm: Normal rate and regular rhythm.     Pulses: Normal pulses and intact distal pulses.     Heart sounds: Normal heart sounds. No murmur heard.    No gallop.  Pulmonary:     Effort: Pulmonary effort is normal.     Breath sounds: Normal breath sounds.  Abdominal:     General: Bowel sounds are normal.     Palpations: Abdomen is soft.  Musculoskeletal:     Right lower leg: No edema.     Left lower leg: No edema.     Allergies  Allergen Reactions   Lisinopril Swelling   Glipizide     Lip swelling      Current Outpatient Medications:    aspirin (ASPIRIN CHILDRENS) 81 MG chewable tablet, Chew 1 tablet (81 mg total) by mouth daily., Disp: 90 tablet, Rfl: 3   JARDIANCE 25 MG TABS tablet, Take 25 mg by mouth daily., Disp: , Rfl:    lansoprazole (PREVACID) 15 MG capsule, Take 15 mg by mouth daily at 12 noon., Disp: , Rfl:    metFORMIN (GLUCOPHAGE) 1000 MG tablet, Take 1,000 mg by mouth 2 (two) times daily with a meal., Disp: , Rfl:  nitroGLYCERIN (NITROSTAT) 0.4 MG SL tablet, Place 1 tablet (0.4 mg total) under the tongue every 5 (five) minutes as needed for up to 25 days for chest pain., Disp: 25 tablet, Rfl: 3   Semaglutide (RYBELSUS) 3 MG TABS, Take 1 tablet by mouth daily., Disp: , Rfl:    atorvastatin (LIPITOR) 10 MG tablet, Take 1 tablet (10 mg total) by mouth daily., Disp: 90 tablet, Rfl: 3 Labs    Lipid Panel     Component Value Date/Time   CHOL 126 03/03/2021 0827   TRIG 122 03/03/2021 0827   HDL 31 (L) 03/03/2021 0827   LDLCALC 73 03/03/2021 0827   LDLDIRECT 98 12/11/2020 0810   HEMOGLOBIN A1C Lab Results  Component Value Date   HGBA1C 7.9 (H) 12/11/2020    Labs 10/06/2020:  Serum glucose 274, BUN 21, creatinine 1.09, EGFR 73 mL, potassium 3.9.  A1c 11.0%.  CARDIAC STUDIES:  LE arterial duplex 04/05/11: Normal ABI, mild plaque without significant stenosis  External Review:  Exercise Myoview stress test 09/13/2018:  No evidence of ischemia, normal LVEF at 72%.  Patient exercised for 7 minutes and achieved 8.4 METS.  Coronary calcium score 12/10/2020: 1. Total Angstrom coronary calcium score of 598, MeSA database percentile 92. Left main: 0 LAD: 375 Left circumflex: 132 RCA: 91. 2. Aortic valvular calcification without evidence of visible aneurysmal dilatation of the visualized ascending thoracic aorta. Correlation with echocardiography may be helpful. 3. Atherosclerosis of the descending thoracic aorta.  PCV MYOCARDIAL PERFUSION WO LEXISCAN 03/09/2021  Narrative Exercise Myoview stress test 03/09/2021: 1 Day Rest/Stress Protocol. Exercise time 40mnutes 11 seconds on Bruce protocol, achieved 7.05 METS, and 86% age-predicted maximum heart rate. Stress ECG negative for ischemia. Mild decrease in tracer uptake in the inferior myocardium more prominent at rest images, likely due to diaphragmatic attenuation. No obvious evidence of reversible myocardial ischemia or prior infarct. Normal LV size, calculated LVEF 60%, no regional wall motion abnormalities. Low risk study.   PCV ECHOCARDIOGRAM COMPLETE 03/09/2021  Narrative Echocardiogram 03/09/2021: Normal LV systolic function with EF 63%. Left ventricle cavity is normal in size. Moderate concentric remodeling of the left ventricle. Normal global wall motion. Doppler evidence of grade I (impaired) diastolic dysfunction, normal LAP. Calculated EF 63%. No significant     EKG:  EKG 07/05/2022: Normal sinus rhythm at rate of 72 bpm, rightward axis, poor R progression, probably normal variant, consider pulmonary disease panel.  No evidence of ischemia, normal QT interval.  Compared to prior EKG 11/12/2021, rightward axis is new.  PVCs (2) not evident.  Assessment & Recommendations:     ICD-10-CM   1. Atherosclerosis of native coronary artery of native heart with stable angina pectoris (HCC)  I25.118 EKG 12-Lead     atorvastatin (LIPITOR) 10 MG tablet    2. Type 2 diabetes mellitus with hyperglycemia, without long-term current use of insulin (HCC)  E11.65     3. Pure hypercholesterolemia  E78.00 atorvastatin (LIPITOR) 10 MG tablet    4. H/O paroxysmal supraventricular tachycardia  Z86.79      Recommendation:  MJordi Lackois a 71y.o[place] patient with uncontrolled diabetes mellitus, hyperlipidemia, history of remote SVT with no recurrence, markedly elevated coronary calcium score in >90th percentile in 2022, last seen normal for 6 months ago for chest pain.  [place] care.  He has been noticing gradual decrease in exercise tolerance.  1. Atherosclerosis of native coronary artery of native  heart with stable angina pectoris ([place]) I reviewed the EKG with the patient, patient has not noticed any marked decrease in exercise tolerance but has been gradual but on further discussion he is really not very active at all.  We reviewed making lifestyle changes and daily exercise.  If he notices marked difference in dyspnea or notices any further chest pain, would have a low threshold to perform cardiac catheterization.  2. Type 2 diabetes mellitus with hyperglycemia, without long-term current use of insulin ([place]) Diabetes is not well-controlled.  Is an appointment to see his PCP in 1 month.  Advised him that he could certainly increase the dose of the Rybelsus to 7 mg or consider changing to Ozempic which would aid in both weight loss and also cardiovascular protection and improvement in A1c.  3. Pure hypercholesterolemia He is presently on Lipitor, plan out of the medication 3 months ago as he was in high lab.  I refilled the prescription.  Lipids were previously well-controlled on 10 mg of Lipitor.  4. H/O paroxysmal supraventricular tachycardia He has not had any further palpitations.  Otherwise remains asymptomatic.  Office visit in 6  months or sooner if problems.     Adrian Prows, MD, [facility] 07/05/2022, 10:01 AM Office: [phone] Fax: [phone] Pager: [phone]

## 2022-08-05 ENCOUNTER — Encounter: Payer: Self-pay | Admitting: Gastroenterology

## 2022-08-24 ENCOUNTER — Ambulatory Visit: Payer: Medicare HMO | Admitting: Gastroenterology

## 2022-11-05 ENCOUNTER — Emergency Department (HOSPITAL_BASED_OUTPATIENT_CLINIC_OR_DEPARTMENT_OTHER): Payer: Medicare HMO

## 2022-11-05 ENCOUNTER — Other Ambulatory Visit (HOSPITAL_BASED_OUTPATIENT_CLINIC_OR_DEPARTMENT_OTHER): Payer: Self-pay

## 2022-11-05 ENCOUNTER — Emergency Department (HOSPITAL_BASED_OUTPATIENT_CLINIC_OR_DEPARTMENT_OTHER)
Admission: EM | Admit: 2022-11-05 | Discharge: 2022-11-05 | Disposition: A | Discharge status: Home / Self Care | Payer: Medicare HMO | Attending: Emergency Medicine | Admitting: Emergency Medicine

## 2022-11-05 ENCOUNTER — Encounter (HOSPITAL_BASED_OUTPATIENT_CLINIC_OR_DEPARTMENT_OTHER): Payer: Self-pay | Admitting: Emergency Medicine

## 2022-11-05 ENCOUNTER — Other Ambulatory Visit: Payer: Self-pay

## 2022-11-05 DIAGNOSIS — R42 Dizziness and giddiness: Secondary | ICD-10-CM | POA: Insufficient documentation

## 2022-11-05 DIAGNOSIS — E119 Type 2 diabetes mellitus without complications: Secondary | ICD-10-CM | POA: Insufficient documentation

## 2022-11-05 DIAGNOSIS — R519 Headache, unspecified: Secondary | ICD-10-CM | POA: Insufficient documentation

## 2022-11-05 DIAGNOSIS — Z7984 Long term (current) use of oral hypoglycemic drugs: Secondary | ICD-10-CM | POA: Diagnosis not present

## 2022-11-05 DIAGNOSIS — Z7982 Long term (current) use of aspirin: Secondary | ICD-10-CM | POA: Insufficient documentation

## 2022-11-05 DIAGNOSIS — I2510 Atherosclerotic heart disease of native coronary artery without angina pectoris: Secondary | ICD-10-CM | POA: Insufficient documentation

## 2022-11-05 LAB — CBC
HCT: 47.6 % (ref 39.0–52.0)
Hemoglobin: 16.1 g/dL (ref 13.0–17.0)
MCH: 29.4 pg (ref 26.0–34.0)
MCHC: 33.8 g/dL (ref 30.0–36.0)
MCV: 86.9 fL (ref 80.0–100.0)
Platelets: 205 10*3/uL (ref 150–400)
RBC: 5.48 MIL/uL (ref 4.22–5.81)
RDW: 13.7 % (ref 11.5–15.5)
WBC: 5.8 10*3/uL (ref 4.0–10.5)
nRBC: 0 % (ref 0.0–0.2)

## 2022-11-05 LAB — BASIC METABOLIC PANEL
Anion gap: 7 (ref 5–15)
BUN: 18 mg/dL (ref 8–23)
CO2: 28 mmol/L (ref 22–32)
Calcium: 9.7 mg/dL (ref 8.9–10.3)
Chloride: 102 mmol/L (ref 98–111)
Creatinine, Ser: 1.01 mg/dL (ref 0.61–1.24)
GFR, Estimated: >60 mL/min (ref >60)
Glucose, Bld: 155 mg/dL — ABNORMAL HIGH (ref 70–99)
Potassium: 4.5 mmol/L (ref 3.5–5.1)
Sodium: 137 mmol/L (ref 135–145)

## 2022-11-05 LAB — CBG MONITORING, ED: Glucose-Capillary: 167 mg/dL — ABNORMAL HIGH (ref 70–99)

## 2022-11-05 NOTE — ED Notes (Signed)
Discharge instructions discussed with pt. Pt verbalized understanding. Pt stable and ambulatory.  °

## 2022-11-05 NOTE — ED Provider Notes (Signed)
[facility] EMERGENCY DEPARTMENT AT [facility] Provider Note   CSN: [id number] Arrival date & time: 11/05/22  7829     History  Chief Complaint  Patient presents with   Gait Problem    Alexander Mckay is a 72 y.o. male.  HPI Past medical history significant for diabetes, hyperlipidemia, distant history of SVT, coronary artery disease.  Patient reports for the past 3 mornings, upon awakening he has been very dizzy.  This lasts about 10 minutes then improves.  Patient denies that it is exacerbated by movements of the head or position change.  No associated visual change.  At the time that is occurring gait is unsteady but there has been no focal weakness numbness or tingling of extremities.  Patient denies onset with severe headache.  However he does endorse a mild associated headache on the left side of the head.  No fever no chills no general illness.  No recent medication changes.    Home Medications Prior to Admission medications   Medication Sig Start Date End Date Taking? Authorizing Provider  aspirin (ASPIRIN CHILDRENS) 81 MG chewable tablet Chew 1 tablet (81 mg total) by mouth daily. 12/10/20   Yates Decamp, MD  atorvastatin (LIPITOR) 10 MG tablet Take 1 tablet (10 mg total) by mouth daily. 07/05/22   Yates Decamp, MD  JARDIANCE 25 MG TABS tablet Take 25 mg by mouth daily. 11/04/21   [provider]  lansoprazole (PREVACID) 15 MG capsule Take 15 mg by mouth daily at 12 noon.    [provider]  metFORMIN (GLUCOPHAGE) 1000 MG tablet Take 1,000 mg by mouth 2 (two) times daily with a meal.    [provider]  nitroGLYCERIN (NITROSTAT) 0.4 MG SL tablet Place 1 tablet (0.4 mg total) under the tongue every 5 (five) minutes as needed for up to 25 days for chest pain. 11/12/21 07/05/22  Yates Decamp, MD  Semaglutide (RYBELSUS) 3 MG TABS Take 1 tablet by mouth daily.    [provider]      Allergies    Lisinopril and Glipizide    Review of  Systems   Review of Systems  Physical Exam Updated Vital Signs BP (!) 153/74 (BP Location: Right Arm)   Pulse 61   Temp 97.7 F (36.5 C) (Oral)   Resp 11   Ht  (1.803 m)   Wt 88.5 kg   SpO2 96%   BMI 27.20 kg/m  Physical Exam Constitutional:      Comments: Alert nontoxic clinically well in appearance.  Well-nourished well-developed.  HENT:     Head: Normocephalic and atraumatic.     Right Ear: Tympanic membrane normal.     Left Ear: Tympanic membrane normal.     Nose: Nose normal.     Mouth/Throat:     Mouth: Mucous membranes are moist.     Pharynx: Oropharynx is clear.  Eyes:     Extraocular Movements: Extraocular movements intact.     Conjunctiva/sclera: Conjunctivae normal.     Pupils: Pupils are equal, round, and reactive to light.  Cardiovascular:     Rate and Rhythm: Normal rate and regular rhythm.  Pulmonary:     Effort: Pulmonary effort is normal.     Breath sounds: Normal breath sounds.  Abdominal:     General: There is no distension.     Palpations: Abdomen is soft.     Tenderness: There is no abdominal tenderness.  Musculoskeletal:        General: No swelling  or tenderness. Normal range of motion.     Cervical back: Neck supple.     Right lower leg: No edema.     Left lower leg: No edema.  Skin:    General: Skin is warm and dry.  Neurological:     General: No focal deficit present.     Mental Status: He is oriented to person, place, and time.     Cranial Nerves: No cranial nerve deficit.     Motor: No weakness.     Coordination: Coordination normal.     Comments: Normal finger-nose exam bilaterally.  No pronator drift.  Normal cognitive function.  Speech clear.  No cranial nerve deficit.  Motor strength 5\5.  Subtle nystagmus to the right, symptoms of vertigo were not triggered by eye movement.  Psychiatric:        Mood and Affect: Mood normal.     ED Results / Procedures / Treatments   Labs (all labs ordered are listed, but only abnormal  results are displayed) Labs Reviewed  BASIC METABOLIC PANEL - Abnormal; Notable for the following components:      Result Value   Glucose, Bld 155 (*)    All other components within normal limits  CBG MONITORING, ED - Abnormal; Notable for the following components:   Glucose-Capillary 167 (*)    All other components within normal limits  CBC  URINALYSIS, ROUTINE W REFLEX MICROSCOPIC    EKG EKG Interpretation  Date/Time:  Friday November 05 2022 09:09:50 EDT Ventricular Rate:  59 PR Interval:  184 QRS Duration: 90 QT Interval:  419 QTC Calculation: 415 R Axis:   80 Text Interpretation: Sinus rhythm Consider left atrial enlargement no acute ischemic appearance Confirmed by Arby Barrette [phone]) on 11/05/2022 9:49:10 AM  Radiology MR Brain Wo Contrast (neuro protocol)  Result Date: 11/05/2022 CLINICAL DATA:  Neuro deficit, acute, stroke suspected. Balance issues x4 days. EXAM: MRI HEAD WITHOUT CONTRAST TECHNIQUE: Multiplanar, multiecho pulse sequences of the brain and surrounding structures were obtained without intravenous contrast. COMPARISON:  MRI brain 02/22/2007. FINDINGS: Brain: No acute infarct or hemorrhage. Moderate chronic small-vessel disease. No hydrocephalus or extra-axial collection. No abnormal susceptibility. Mass or midline shift. Vascular: Normal flow voids. Skull and upper cervical spine: Normal marrow signal. Sinuses/Orbits: Unremarkable. Other: None. IMPRESSION: 1. No acute intracranial abnormality or mass. 2. Moderate chronic small-vessel disease. Electronically Signed   By: Orvan Falconer M.D.   On: 11/05/2022 11:05    Procedures Procedures    Medications Ordered in ED Medications - No data to display  ED Course/ Medical Decision Making/ A&P                             Medical Decision Making Amount and/or Complexity of Data Reviewed Labs: ordered. Radiology: ordered.   Patient has had 3 days of vertigo.  Symptoms have been self-limited.  Is occurring  upon awakening but not triggered by typical symptoms of movement or position change.  Patient reports some mild left-sided headache.  Will proceed with diagnostic evaluation to include MRI.  differential diagnosis includes CVA\positional vertigo\mass or tumor effect.  Physical examination at this time is normal.  Basic lab work normal except blood glucose of 155.  Normal CBC.  MRI interpreted by radiology no acute stroke or tumor or mass effect.  At this time with patient well appearance and normal neurologic exam, vertigo that is self-limited in the mornings and normal MRI, I have low suspicion  for central etiology for vertigo.  At this time I suspect this is peripheral vertigo triggered by positioning in the mornings.  Patient also describes some significant sinus allergy.  Possibly increased sinus pressure in the a.m. is triggering vertigo.  Given that symptoms are self-limited and patient is not symptomatic through the day with any impairment usual activities, I do not feel he would benefit from use of Antivert at this time.  He will continue to use his Allegra for sinus symptoms.  My recommendation at this time is for patient to follow-up with his PCP and ear nose throat evaluation.  We have reviewed reviewed careful return precautions for any stroke symptoms and immediate return.  Patient and his wife voiced understanding.  All results reviewed with patient's wife at bedside.  Questions answered.        Final Clinical Impression(s) / ED Diagnoses Final diagnoses:  Vertigo    Rx / DC Orders ED Discharge Orders     None         Arby Barrette, MD 11/05/22 1128

## 2022-11-05 NOTE — Discharge Instructions (Signed)
1.  At this time it appears most likely that you have benign positional vertigo.  This is a problem with that in her ear where balance is generated to the brain.  You need to follow-up with your doctor and continue to monitor this and discuss referral to an ear nose throat specialist. 2.  At this time, your MRI does not show evidence of a stroke.  However, if you develop any problems with your vision especially double vision, severe headache, confusion, difficulty with speech or movement, return to the emergency department immediately for reassessment.

## 2022-11-05 NOTE — ED Triage Notes (Signed)
Pt via pov from home with balance issues x 3 days. Pt states when he wakes up in the morning, he has problems with his balance. He reports that he is fine later in the day. He also reports that he has seasonal allergies that have been bothering him. Only other symptom is throbbing in his left ear. Pt alert & oriented x 4; no speech or balance issues present at triage. NAD noted.

## 2022-11-12 ENCOUNTER — Ambulatory Visit: Payer: Medicare HMO | Admitting: Cardiology

## 2023-05-01 IMAGING — CT CT CARDIAC CORONARY ARTERY CALCIUM SCORE
3 series · 14 of 20 positions shown, 16 images · non-contrast
Comparison: None.

CLINICAL DATA: 70-year-old [place] male with history of
hyperlipidemia and family history of heart disease.

EXAM:
CT CARDIAC CORONARY ARTERY CALCIUM SCORE
TECHNIQUE: Non-contrast imaging through the heart was performed using
prospective ECG gating. Image post processing was performed on an
independent workstation, allowing for quantitative analysis of the
heart and coronary arteries. Note that this exam targets the heart
and the chest was not imaged in its entirety.

[Series 2: calcium scoring 2.00 qr36 bestdiast 70% hrt calciu · axial · 0.44mm/px · z∈[+1632,+1704]mm · 4 of 60 slices shown]
[im 12/60  vessel]
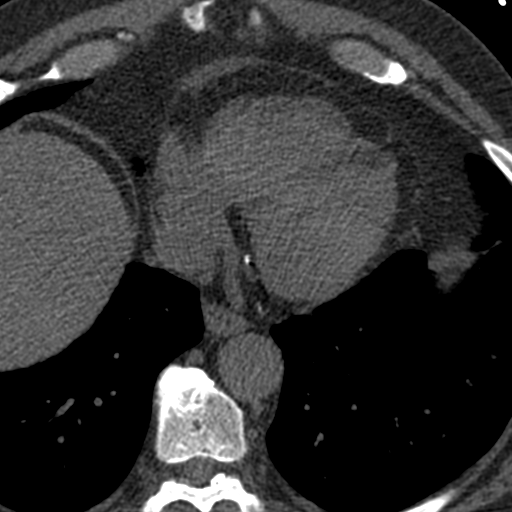
[im 24/60  vessel]
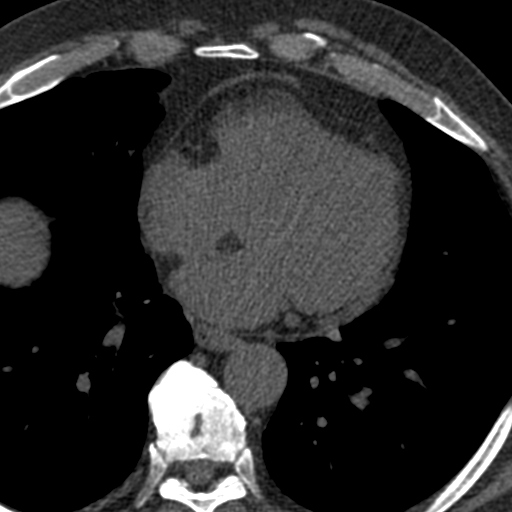
[im 36/60  vessel]
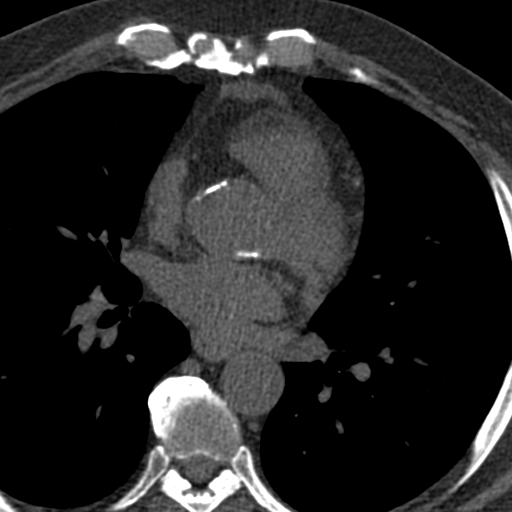
[im 48/60  vessel]
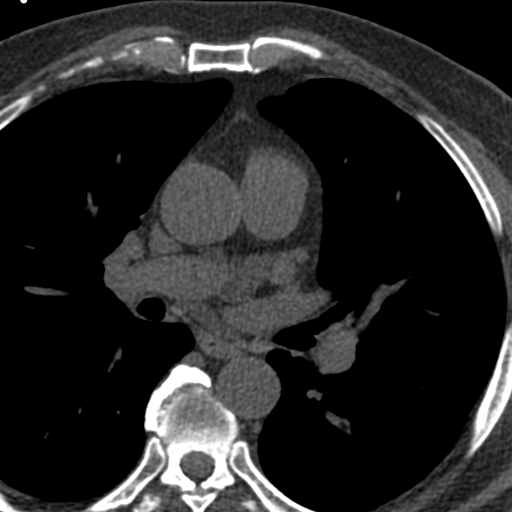

[Series 3: calcium scoring 2.00 br40 bestdiast 70% axial · axial · 0.62mm/px · z∈[+1628,+1708]mm · 5 of 60 slices shown, 7 images]
[im 10/60  vessel]
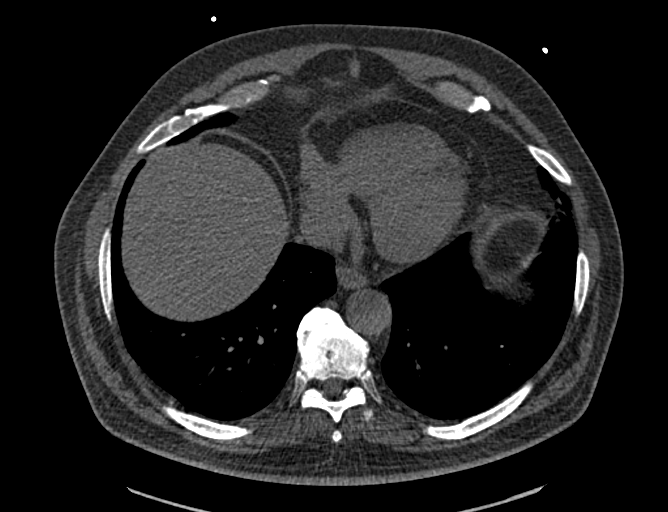
[im 10/60  lung]
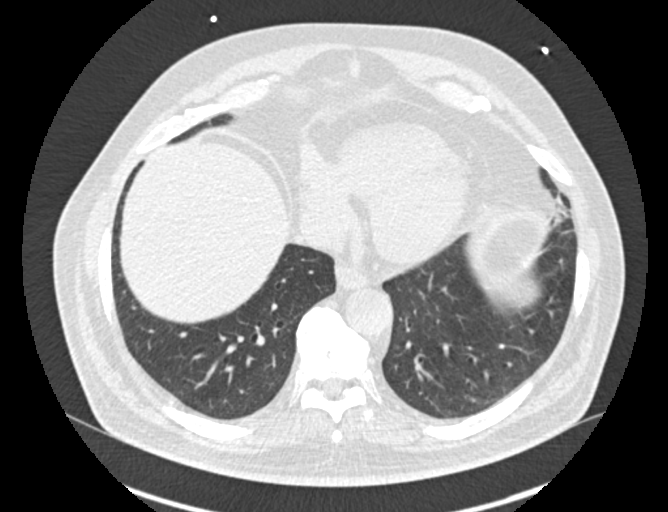
[im 20/60  vessel]
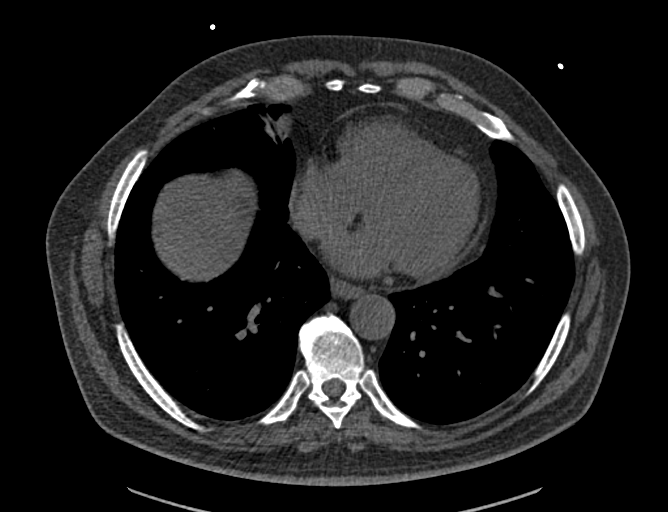
[im 30/60  vessel]
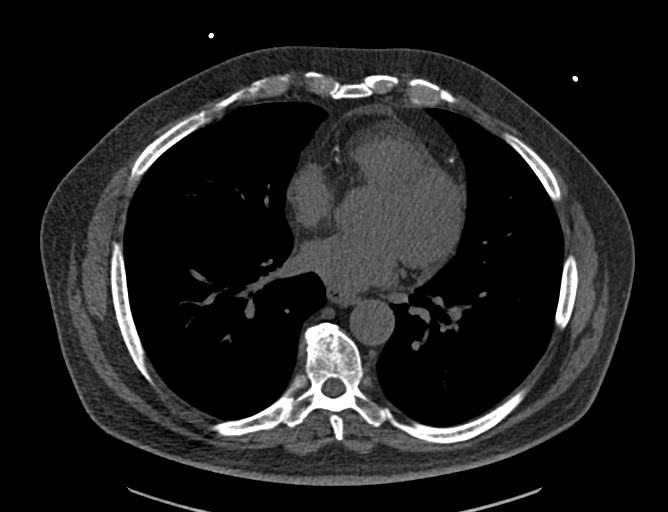
[im 40/60  vessel]
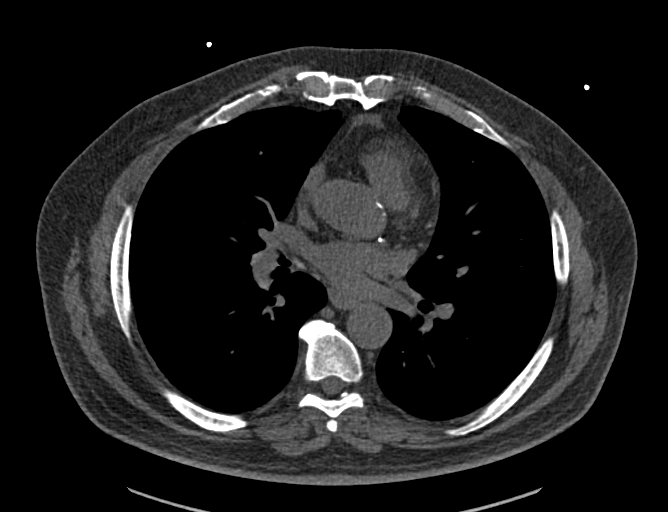
[im 50/60  vessel]
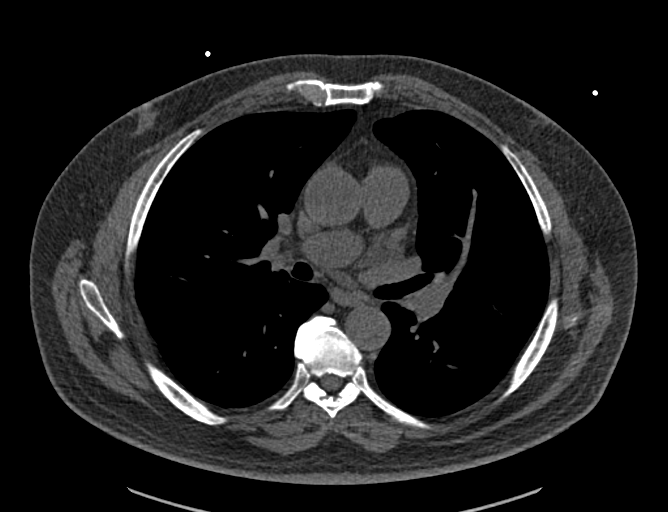
[im 50/60  lung]
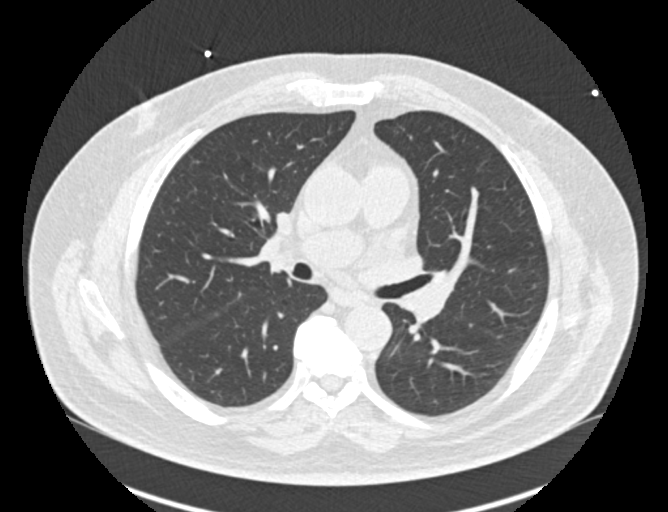

[Series 9: calcium scoring 2.00 br60 bestdiast 70% lungs · axial · 0.62mm/px · z∈[+1628,+1708]mm · 5 of 60 slices shown]
[im 10/60  vessel]
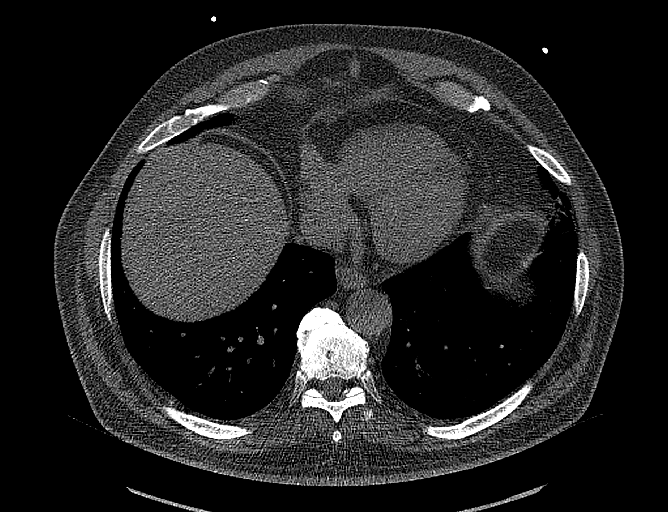
[im 20/60  vessel]
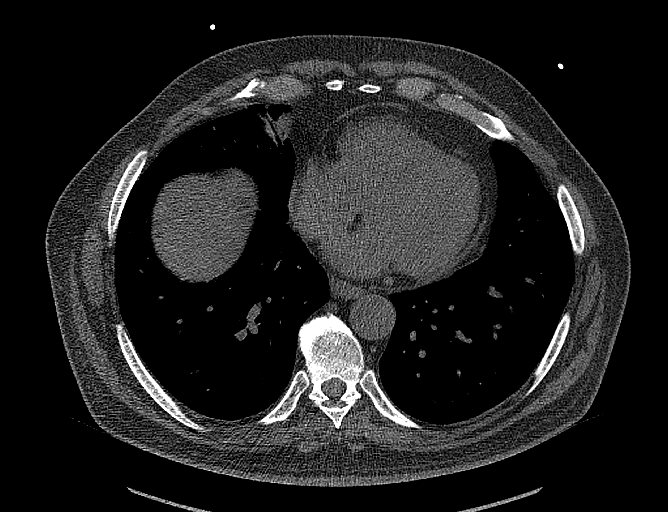
[im 30/60  vessel]
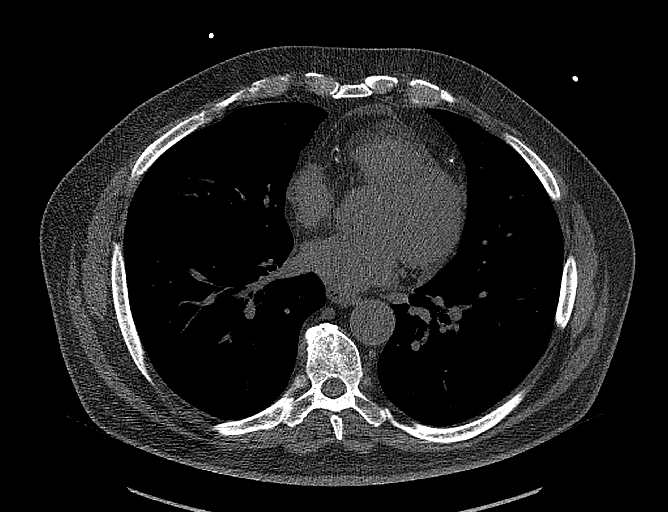
[im 40/60  vessel]
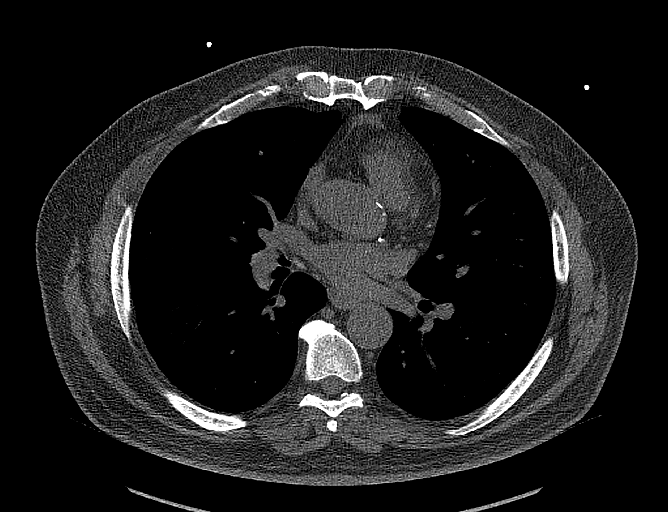
[im 50/60  vessel]
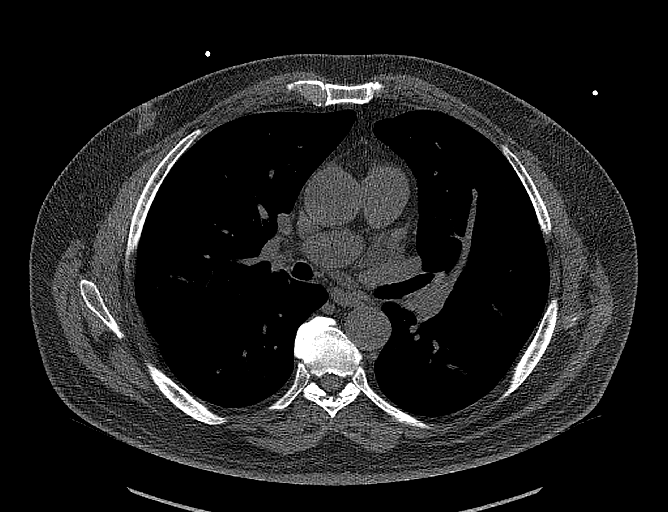

[14 of 20 positions shown; findings below may reference images not displayed]

FINDINGS: CORONARY CALCIUM SCORES:

Left Main: 0

LAD: 375

LCx: 132

RCA: 91

Total Agatston Score: 598

[HOSPITAL] percentile: 92

AORTA MEASUREMENTS:

Ascending Aorta: 36 mm

Descending Aorta: 28 mm

OTHER FINDINGS:

The heart size is within normal limits. No pericardial fluid
identified. There are some calcifications associated with the aortic
valve and aortic root. Visualized segments of the thoracic aorta and
central pulmonary arteries are normal in caliber. Calcified plaque
present in the descending thoracic aorta. Visualized mediastinum and
hilar regions demonstrate no lymphadenopathy or masses. Visualized
lungs show no evidence of pulmonary edema, consolidation,
pneumothorax, nodule or pleural fluid. Visualized upper abdomen and
bony structures are unremarkable.
IMPRESSION: 1. Coronary calcium score of 598 is at the 92nd percentile for the
patient's age, sex and race.
2. Aortic valvular calcification without evidence of visible
aneurysmal dilatation of the visualized ascending thoracic aorta.
Correlation with echocardiography may be helpful.
3. Atherosclerosis of the descending thoracic aorta.

## 2023-08-25 ENCOUNTER — Other Ambulatory Visit: Payer: Self-pay | Admitting: Internal Medicine

## 2023-08-25 DIAGNOSIS — R0609 Other forms of dyspnea: Secondary | ICD-10-CM

## 2023-09-01 ENCOUNTER — Ambulatory Visit: Payer: Medicare HMO

## 2023-09-01 DIAGNOSIS — I200 Unstable angina: Secondary | ICD-10-CM | POA: Diagnosis not present

## 2023-09-01 DIAGNOSIS — R0609 Other forms of dyspnea: Secondary | ICD-10-CM | POA: Diagnosis not present

## 2023-10-28 ENCOUNTER — Encounter: Payer: Self-pay | Admitting: Cardiology

## 2023-10-28 ENCOUNTER — Ambulatory Visit: Payer: Medicare HMO | Attending: Cardiology | Admitting: Cardiology

## 2023-10-28 VITALS — BP 136/72 | HR 70 | Resp 16 | Ht 71.0 in | Wt 198.0 lb

## 2023-10-28 DIAGNOSIS — E1165 Type 2 diabetes mellitus with hyperglycemia: Secondary | ICD-10-CM | POA: Diagnosis not present

## 2023-10-28 DIAGNOSIS — I25118 Atherosclerotic heart disease of native coronary artery with other forms of angina pectoris: Secondary | ICD-10-CM | POA: Diagnosis not present

## 2023-10-28 DIAGNOSIS — E7800 Pure hypercholesterolemia, unspecified: Secondary | ICD-10-CM | POA: Diagnosis not present

## 2023-10-28 NOTE — Progress Notes (Signed)
 Cardiology Office Note:  .   Date:  10/28/2023  ID:  Alexander Mckay, DOB 1951/03/14, MRN [id number] PCP: Sherrie Mustache, MD  [facility] Providers Cardiologist:  Yates Decamp, MD   History of Present Illness: Alexander Mckay   Alexander Mckay is a 73 y.o. [place] male patient with uncontrolled diabetes mellitus, hyperlipidemia, history of remote SVT with no recurrence, markedly elevated coronary calcium score in >90th percentile in 2022.  He underwent exercise nuclear stress test on 09/01/2023 by his PCP, and hence a urgent referral was made.  Patient is asymptomatic, has been exercising 4 days a week in the gym, he has no other specific complaints.  He is accompanied by his friend at the bedside today.  The patient, with a history of diabetes, presents for a follow-up visit after a recent trip to [place]. He reports regular exercise both during his trip and since his return, with no reported issues or symptoms during physical activity. The patient has been on medication for diabetes, but the control of his diabetes has been a concern.   Labs    Lab Results  Component Value Date   NA 137 11/05/2022   K 4.5 11/05/2022   CO2 28 11/05/2022   GLUCOSE 155 (H) 11/05/2022   BUN 18 11/05/2022   CREATININE 1.01 11/05/2022   CALCIUM 9.7 11/05/2022   GFR 76.97 11/29/2018   GFRNONAA >60 11/05/2022      Latest Ref Rng & Units 11/05/2022    9:26 AM 11/29/2018    2:58 PM 08/24/2010    1:46 PM  BMP  Glucose 70 - 99 mg/dL 696  295  284   BUN 8 - 23 mg/dL 18  23  15    Creatinine 0.61 - 1.24 mg/dL 1.32  4.40  0.9   Sodium 135 - 145 mmol/L 137  138  138   Potassium 3.5 - 5.1 mmol/L 4.5  4.2  4.7   Chloride 98 - 111 mmol/L 102  102  101   CO2 22 - 32 mmol/L 28  28  30    Calcium 8.9 - 10.3 mg/dL 9.7  9.4  9.2       Latest Ref Rng & Units 11/05/2022    9:26 AM  CBC  WBC 4.0 - 10.5 K/uL 5.8   Hemoglobin 13.0 - 17.0 g/dL 10.2   Hematocrit 72.5 - 52.0 % 47.6   Platelets 150 - 400 K/uL 205    Lab  Results  Component Value Date   HGBA1C 7.9 (H) 12/11/2020   External Labs:  Care Everywhere labs 07/27/2022:  Total cholesterol 163, triglycerides 113, HDL 39, LDL 103.  Labs 11/02/2022:  BUN 22, creatinine 1.13, EGFR 69 mL.  Hb 16.1/HCT 47.5, platelets 115.  A1c 7.6%.  Review of Systems  Cardiovascular:  Negative for chest pain, dyspnea on exertion and leg swelling.   Physical Exam:   VS:  BP 136/72 (BP Location: Left Arm, Patient Position: Sitting, Cuff Size: Normal)   Pulse 70   Resp 16   Ht 5\' 11"  (1.803 m)   Wt 198 lb (89.8 kg)   SpO2 93%   BMI 27.62 kg/m    Wt Readings from Last 3 Encounters:  10/28/23 198 lb (89.8 kg)  11/05/22 195 lb (88.5 kg)  07/05/22 209 lb (94.8 kg)    Physical Exam Neck:     Vascular: No carotid bruit or JVD.  Cardiovascular:     Rate and Rhythm: Normal rate and regular rhythm.     Pulses: Intact  distal pulses.     Heart sounds: Normal heart sounds. No murmur heard.    No gallop.  Pulmonary:     Effort: Pulmonary effort is normal.     Breath sounds: Normal breath sounds.  Abdominal:     General: Bowel sounds are normal.     Palpations: Abdomen is soft.  Musculoskeletal:     Right lower leg: No edema.     Left lower leg: No edema.    Studies Reviewed: Alexander Mckay    Coronary calcium score 12/10/2020: 1. Total Angstrom coronary calcium score of 598, MeSA database percentile 92. Left main: 0 LAD: 375 Left circumflex: 132 RCA: 91. 2. Aortic valvular calcification without evidence of visible aneurysmal dilatation of the visualized ascending thoracic aorta. Correlation with echocardiography may be helpful. 3. Atherosclerosis of the descending thoracic aorta.  Exercise nuclear stress test 09/01/2023: Exercise duration on Bruce protocol for 5 minutes achieving 7.0 METS, symptoms shortness of breath.  No EKG evidence of ischemia. Distal anteroseptal wall defect with some redistribution question ischemia. Normal LVEF at 69%.  No focal wall  motion abnormality.  EKG:    EKG Interpretation Date/Time:  Friday October 28 2023 09:03:17 EDT Ventricular Rate:  69 PR Interval:  168 QRS Duration:  104 QT Interval:  404 QTC Calculation: 432 R Axis:   -61  Text Interpretation: EKG 10/28/2023: Normal sinus rhythm at rate of 69 bpm, left anterior fascicular block.  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  Compared to 09/01/2023, no significant change.  No change from 11/12/2021 and also 09/23/2021. Confirmed by Delrae Rend [phone]) on 10/28/2023 9:32:16 AM    EKG 09/01/2023: Normal sinus rhythm at the rate of 64 bpm, poor R wave progression, cannot exclude anteroseptal infarct old.   Medications and allergies    Allergies  Allergen Reactions   Lisinopril Swelling   Glipizide     Lip swelling      Current Outpatient Medications:    aspirin (ASPIRIN CHILDRENS) 81 MG chewable tablet, Chew 1 tablet (81 mg total) by mouth daily., Disp: 90 tablet, Rfl: 3   atorvastatin (LIPITOR) 10 MG tablet, Take 1 tablet (10 mg total) by mouth daily., Disp: 90 tablet, Rfl: 3   JARDIANCE 25 MG TABS tablet, Take 25 mg by mouth daily., Disp: , Rfl:    lansoprazole (PREVACID) 15 MG capsule, Take 15 mg by mouth daily at 12 noon., Disp: , Rfl:    metFORMIN (GLUCOPHAGE) 1000 MG tablet, Take 1,000 mg by mouth 2 (two) times daily with a meal., Disp: , Rfl:    Semaglutide (RYBELSUS) 3 MG TABS, Take 1 tablet by mouth daily., Disp: , Rfl:    nitroGLYCERIN (NITROSTAT) 0.4 MG SL tablet, Place 1 tablet (0.4 mg total) under the tongue every 5 (five) minutes as needed for up to 25 days for chest pain., Disp: 25 tablet, Rfl: 3   No orders of the defined types were placed in this encounter.   There are no discontinued medications.   ASSESSMENT AND PLAN: .      ICD-10-CM   1. Atherosclerosis of native coronary artery of native heart with stable angina pectoris (HCC)  I25.118 EKG 12-Lead    2. Pure hypercholesterolemia  E78.00      1. Atherosclerosis of native coronary artery  of native heart with stable angina pectoris (HCC) (Primary) Patient presents to me for follow-up of abnormal nuclear stress test that was performed by his PCP, I do not have any images directly to compare.  However upon evaluation of the  patient, he has had no changes in his EKG although EKG suggest extensive anterolateral infarct, these changes have been stable and normal.  Also the LVEF is normal without wall motion abnormality by nuclear stress testing as well.  Hence overall even if he did indeed have mild ischemia at the apical LAD distribution, it would be considered low risk. Continue aspirin 81 mg daily, high intensity statin, continue to exercise regularly.  I would recommend aggressive medical therapy and risk management before proceeding with any invasive approach or even CT angiogram.  His diabetes is uncontrolled, lipids are not at goal, needs aggressive medical therapy for the same. - EKG 12-Lead  2. Pure hypercholesterolemia I do not have his recent lipids, goal LDL <70.  He is only on low-dose of Lipitor 10 mg daily.  If his LDL is >70, would consider increasing Lipitor to 40 mg daily and also consider addition of Zetia.  3. Type 2 diabetes mellitus with hyperglycemia, without long-term current use of insulin (HCC) Diabetes has been uncontrolled for a long time, would consider GLP-1 agonist for diabetes control.  Presently on Jardiance 25 mg daily.  He is also not on an ACE inhibitor or an ARB, in view of diabetes mellitus, would recommend starting him on losartan 25 mg in the evening.  I will forward my recommendations to his PCP for management.  Controlling diabetes is crucial to prevent complications, especially with coronary artery disease. A GLP-1 agonist is recommended for diabetes control, not weight loss, and is beneficial for cardiac health.  This was a 35-minute office visit encounter in answering all the questions regarding invasive approach to a low risk exercise stress  test in a patient was completely asymptomatic, management of risk factors, evaluation of his medical records from external sources.   Signed,  Yates Decamp, MD, [facility] 10/28/2023, 9:38 AM [facility] [address] [place], [place] [address] Phone: [phone]. Fax:  [phone]

## 2023-10-28 NOTE — Patient Instructions (Signed)
 Medication Instructions:  Your physician recommends that you continue on your current medications as directed. Please refer to the Current Medication list given to you today.  *If you need a refill on your cardiac medications before your next appointment, please call your pharmacy*  Lab Work: none If you have labs (blood work) drawn today and your tests are completely normal, you will receive your results only by: MyChart Message (if you have MyChart) OR A paper copy in the mail If you have any lab test that is abnormal or we need to change your treatment, we will call you to review the results.  Testing/Procedures: none  Follow-Up: At [facility], you and your health needs are our priority.  As part of our continuing mission to provide you with exceptional heart care, our providers are all part of one team.  This team includes your primary Cardiologist (physician) and Advanced Practice Providers or APPs (Physician Assistants and Nurse Practitioners) who all work together to provide you with the care you need, when you need it.  Your next appointment:   12 month(s)  Provider:   Yates Decamp, MD     We recommend signing up for the patient portal called "MyChart".  Sign up information is provided on this After Visit Summary.  MyChart is used to connect with patients for Virtual Visits (Telemedicine).  Patients are able to view lab/test results, encounter notes, upcoming appointments, etc.  Non-urgent messages can be sent to your provider as well.   To learn more about what you can do with MyChart, go to [email or web address].   Other Instructions        1st Floor: - Lobby - Registration  - Pharmacy  - Lab - Cafe  2nd Floor: - PV Lab - Diagnostic Testing (echo, CT, nuclear med)  3rd Floor: - Vacant  4th Floor: - TCTS (cardiothoracic surgery) - AFib Clinic - Structural Heart Clinic - Vascular Surgery  - Vascular Ultrasound  5th Floor: - HeartCare Cardiology  (general and EP) - Clinical Pharmacy for coumadin, hypertension, lipid, weight-loss medications, and med management appointments    Valet parking services will be available as well.
